# Patient Record
Sex: Male | Born: 1960 | Race: Black or African American | Hispanic: No | Marital: Single | Smoking: Current every day smoker
Health system: Southern US, Community
[De-identification: ages and names within clinical notes are randomized; demographics above are authoritative.]

## PROBLEM LIST (undated history)

## (undated) DIAGNOSIS — R319 Hematuria, unspecified: Principal | ICD-10-CM

## (undated) DIAGNOSIS — M1990 Unspecified osteoarthritis, unspecified site: Secondary | ICD-10-CM

## (undated) DIAGNOSIS — I10 Essential (primary) hypertension: Secondary | ICD-10-CM

## (undated) DIAGNOSIS — K219 Gastro-esophageal reflux disease without esophagitis: Secondary | ICD-10-CM

## (undated) HISTORY — PX: JOINT REPLACEMENT: SHX530

---

## 2014-06-27 ENCOUNTER — Encounter (HOSPITAL_COMMUNITY): Payer: Self-pay | Admitting: Emergency Medicine

## 2014-06-27 ENCOUNTER — Emergency Department (HOSPITAL_COMMUNITY)
Admission: EM | Admit: 2014-06-27 | Discharge: 2014-06-28 | Disposition: A | Discharge status: Home / Self Care | Payer: Medicare Other | Attending: Emergency Medicine | Admitting: Emergency Medicine

## 2014-06-27 DIAGNOSIS — Z720 Tobacco use: Secondary | ICD-10-CM | POA: Diagnosis not present

## 2014-06-27 DIAGNOSIS — Z96652 Presence of left artificial knee joint: Secondary | ICD-10-CM | POA: Insufficient documentation

## 2014-06-27 DIAGNOSIS — I10 Essential (primary) hypertension: Secondary | ICD-10-CM | POA: Insufficient documentation

## 2014-06-27 DIAGNOSIS — M25461 Effusion, right knee: Secondary | ICD-10-CM

## 2014-06-27 DIAGNOSIS — M25462 Effusion, left knee: Secondary | ICD-10-CM | POA: Insufficient documentation

## 2014-06-27 DIAGNOSIS — Z96651 Presence of right artificial knee joint: Secondary | ICD-10-CM | POA: Diagnosis not present

## 2014-06-27 DIAGNOSIS — Z8719 Personal history of other diseases of the digestive system: Secondary | ICD-10-CM | POA: Diagnosis not present

## 2014-06-27 HISTORY — DX: Essential (primary) hypertension: I10

## 2014-06-27 HISTORY — DX: Gastro-esophageal reflux disease without esophagitis: K21.9

## 2014-06-27 HISTORY — DX: Unspecified osteoarthritis, unspecified site: M19.90

## 2014-06-27 NOTE — ED Notes (Signed)
Per EMS, Pt had a knee replacement three years ago and over the past month his L knee and leg has been swelling off and on. PCP sts that it's fluid and that drawing it off would do no good because it would just come back. Pt ambulatory. A&Ox4.

## 2014-06-28 ENCOUNTER — Emergency Department (HOSPITAL_COMMUNITY): Payer: Medicare Other

## 2014-06-28 ENCOUNTER — Ambulatory Visit: Payer: Self-pay | Admitting: Internal Medicine

## 2014-06-28 DIAGNOSIS — M25462 Effusion, left knee: Secondary | ICD-10-CM | POA: Diagnosis not present

## 2014-06-28 MED ORDER — HYDROCODONE-ACETAMINOPHEN 5-325 MG PO TABS: 1.0000 | ORAL_TABLET | Freq: Two times a day (BID) | ORAL | Status: AC | PRN

## 2014-06-28 MED ORDER — HYDROCODONE-ACETAMINOPHEN 5-325 MG PO TABS
2.0000 | ORAL_TABLET | Freq: Once | ORAL | Status: AC
  Administered 2014-06-28: 2 via ORAL
  Filled 2014-06-28: qty 2

## 2014-06-28 NOTE — Discharge Instructions (Signed)
Knee Effusion Mr. Shane Glenn, your knee x-rays does not show any fracture. Follow-up with orthopedic surgery within 3 days for continued management. If symptoms worsen come back to the emergency department immediately. Thank you.  Knee effusion means you have fluid in your knee. The knee may be more difficult to bend and move. HOME CARE  Use crutches or a brace as told by your doctor.  Put ice on the injured area.  Put ice in a plastic bag.  Place a towel between your skin and the bag.  Leave the ice on for 15-20 minutes, 03-04 times a day.  Raise (elevate) your knee as much as possible.  Only take medicine as told by your doctor.  You may need to do strengthening exercises. Ask your doctor.  Continue with your normal diet and activities as told by your doctor. GET HELP RIGHT AWAY IF:  You have more puffiness (swelling) in your knee.  You see redness, puffiness, or have more pain in your knee.  You have a temperature by mouth above 102 F (38.9 C).  You get a rash.  You have trouble breathing.  You have a reaction to any medicine you are taking.  You have a lot of pain when you move your knee. MAKE SURE YOU:  Understand these instructions.  Will watch your condition.  Will get help right away if you are not doing well or get worse. Document Released: 05/18/2010 Document Revised: 07/08/2011 Document Reviewed: 05/18/2010 [facility] Patient Information 2015 [employer] [place]. This information is not intended to replace advice given to you by your health care provider. Make sure you discuss any questions you have with your health care provider.

## 2014-06-28 NOTE — ED Provider Notes (Signed)
CSN: [id number]     Arrival date & time 06/27/14  2147 History   First MD Initiated Contact with Patient 06/28/14 0012     Chief Complaint  Patient presents with  . Knee Pain  . Joint Swelling     (Consider location/radiation/quality/duration/timing/severity/associated sxs/prior Treatment) HPI   Shane Glenn is a 54 y.o. male with past medical history of multiple knee replacements presenting today with knee swelling. Patient states he had a right knee replacement thousand 10, followed by left knee replacement in 2011, 2012, and 2013. He has had on-and-off swelling since then for years. He denies any new trauma. His episode currently began at the beginning of February and he believes there is fluid in the joint. [facility] [place] [facility] and does not have a primary care physician or orthopedic surgeon. He states both knees are swollen but left is worse than right. He denies any fevers or recent infections. Patient has been taking Norco for pain relief. He has no further complaints.  10 Systems reviewed and are negative for acute change except as noted in the HPI.    Past Medical History  Diagnosis Date  . Hypertension   . Arthritis   . GERD (gastroesophageal reflux disease)    Past Surgical History  Procedure Laterality Date  . Joint replacement     No family history on file. History  Substance Use Topics  . Smoking status: Current Every Day Smoker -- 0.50 packs/day  . Smokeless tobacco: Not on file  . Alcohol Use: Yes    Review of Systems    Allergies  Celebrex and Ibuprofen  Home Medications   Prior to Admission medications   Not on File   BP 138/84 mmHg  Pulse 76  Temp(Src) 98.5 F (36.9 C) (Oral)  Resp 16  SpO2 98% Physical Exam  Constitutional: He is oriented to person, place, and time. Vital signs are normal. He appears well-developed and well-nourished.  Non-toxic appearance. He does not appear ill. No distress.  HENT:  Head: Normocephalic and  atraumatic.  Nose: Nose normal.  Mouth/Throat: Oropharynx is clear and moist. No oropharyngeal exudate.  Eyes: Conjunctivae and EOM are normal. Pupils are equal, round, and reactive to light. No scleral icterus.  Neck: Normal range of motion. Neck supple. No tracheal deviation, no edema, no erythema and normal range of motion present. No thyroid mass and no thyromegaly present.  Cardiovascular: Normal rate, regular rhythm, S1 normal, S2 normal, normal heart sounds, intact distal pulses and normal pulses.  Exam reveals no gallop and no friction rub.   No murmur heard. Pulses:      Radial pulses are 2+ on the right side, and 2+ on the left side.       Dorsalis pedis pulses are 2+ on the right side, and 2+ on the left side.  Pulmonary/Chest: Effort normal and breath sounds normal. No respiratory distress. He has no wheezes. He has no rhonchi. He has no rales.  Abdominal: Soft. Normal appearance and bowel sounds are normal. He exhibits no distension, no ascites and no mass. There is no hepatosplenomegaly. There is no tenderness. There is no rebound, no guarding and no CVA tenderness.  Musculoskeletal: Normal range of motion. He exhibits no edema or tenderness.  Bilateral well-healed scars of bilateral knees. There is no warmth, erythema, or tenderness to palpation. Knee is obviously swollen.  Lymphadenopathy:    He has no cervical adenopathy.  Neurological: He is alert and oriented to person, place, and time.  He has normal strength. No cranial nerve deficit or sensory deficit.  Skin: Skin is warm, dry and intact. No petechiae and no rash noted. He is not diaphoretic. No erythema. No pallor.  Nursing note and vitals reviewed.   ED Course  Procedures (including critical care time) Labs Review Labs Reviewed - No data to display  Imaging Review Dg Knee Complete 4 Views Left  06/28/2014   CLINICAL DATA:  Chronic intermittent left knee swelling for 1 month, now worsening and spreading to the left  ankle. Initial encounter.  EXAM: LEFT KNEE - COMPLETE 4+ VIEW  COMPARISON:  None.  FINDINGS: There is no evidence of fracture or dislocation. There is loosening of the femoral component of the patient's prosthesis, with lucency measuring up to 7 mm surrounding the femoral stem. The tibial component is grossly unremarkable. Degenerative change is noted with regard to the patella. Scattered postoperative fragments are seen about the knee joint.  A moderate knee joint effusion is suspected.  IMPRESSION: 1. No evidence of fracture or dislocation. 2. Loosening of the femoral component of the patient's prosthesis, with lucency measuring up to 7 mm surrounding the femoral stem. 3. Suspect moderate knee joint effusion.   Electronically Signed   By: Roanna Raider M.D.   On: 06/28/2014 01:00     EKG Interpretation None      MDM   Final diagnoses:  None    Patient since emergency department for worsening bilateral knee swelling. This and the setting of having multiple knee replacement surgeries. I have no concern for septic arthritis as there is no warmth, tenderness, or erythema around the knee. X-rays were obtained by triage notes do not show any acute abnormalities other than loosening of femoral component, this is likely chronic given his surgical history. I discussed the case with Dr. Eulah Pont who agrees with plan and close ortho follow up.  Patient will be given a short course of Norco for pain relief and orthopedic surgery follow-up. His vital signs remain within his normal limits is safe for discharge.    Tomasita Crumble, MD 06/28/14 [phone]

## 2014-09-01 ENCOUNTER — Emergency Department (HOSPITAL_COMMUNITY): Payer: Medicare Other

## 2014-09-01 ENCOUNTER — Encounter (HOSPITAL_COMMUNITY): Payer: Self-pay

## 2014-09-01 ENCOUNTER — Emergency Department (HOSPITAL_COMMUNITY)
Admission: EM | Admit: 2014-09-01 | Discharge: 2014-09-01 | Disposition: A | Discharge status: Home / Self Care | Payer: Medicare Other | Attending: Emergency Medicine | Admitting: Emergency Medicine

## 2014-09-01 DIAGNOSIS — Z79899 Other long term (current) drug therapy: Secondary | ICD-10-CM | POA: Insufficient documentation

## 2014-09-01 DIAGNOSIS — I10 Essential (primary) hypertension: Secondary | ICD-10-CM | POA: Diagnosis not present

## 2014-09-01 DIAGNOSIS — R109 Unspecified abdominal pain: Secondary | ICD-10-CM | POA: Diagnosis present

## 2014-09-01 DIAGNOSIS — Z791 Long term (current) use of non-steroidal anti-inflammatories (NSAID): Secondary | ICD-10-CM | POA: Diagnosis not present

## 2014-09-01 DIAGNOSIS — Z7952 Long term (current) use of systemic steroids: Secondary | ICD-10-CM | POA: Diagnosis not present

## 2014-09-01 DIAGNOSIS — N401 Enlarged prostate with lower urinary tract symptoms: Secondary | ICD-10-CM | POA: Insufficient documentation

## 2014-09-01 DIAGNOSIS — K219 Gastro-esophageal reflux disease without esophagitis: Secondary | ICD-10-CM | POA: Insufficient documentation

## 2014-09-01 DIAGNOSIS — M1990 Unspecified osteoarthritis, unspecified site: Secondary | ICD-10-CM | POA: Diagnosis not present

## 2014-09-01 DIAGNOSIS — Z792 Long term (current) use of antibiotics: Secondary | ICD-10-CM | POA: Insufficient documentation

## 2014-09-01 DIAGNOSIS — N400 Enlarged prostate without lower urinary tract symptoms: Secondary | ICD-10-CM

## 2014-09-01 DIAGNOSIS — R339 Retention of urine, unspecified: Secondary | ICD-10-CM | POA: Insufficient documentation

## 2014-09-01 LAB — URINALYSIS, ROUTINE W REFLEX MICROSCOPIC
Bilirubin Urine: NEGATIVE
Bilirubin Urine: NEGATIVE
GLUCOSE, UA: NEGATIVE mg/dL
Glucose, UA: NEGATIVE mg/dL
Ketones, ur: 15 mg/dL — AB
Ketones, ur: NEGATIVE mg/dL
LEUKOCYTES UA: NEGATIVE
Nitrite: NEGATIVE
Nitrite: NEGATIVE
PH: 6.5 (ref 5.0–8.0)
Protein, ur: NEGATIVE mg/dL
Protein, ur: NEGATIVE mg/dL
Specific Gravity, Urine: 1.018 (ref 1.005–1.030)
Specific Gravity, Urine: 1.026 (ref 1.005–1.030)
Urobilinogen, UA: 0.2 mg/dL (ref 0.0–1.0)
Urobilinogen, UA: 0.2 mg/dL (ref 0.0–1.0)
pH: 5.5 (ref 5.0–8.0)

## 2014-09-01 LAB — COMPREHENSIVE METABOLIC PANEL
ALBUMIN: 3.7 g/dL (ref 3.5–5.0)
ALT: 35 U/L (ref 17–63)
AST: 26 U/L (ref 15–41)
Alkaline Phosphatase: 93 U/L (ref 38–126)
Anion gap: 10 (ref 5–15)
BUN: 10 mg/dL (ref 6–20)
CO2: 22 mmol/L (ref 22–32)
CREATININE: 0.91 mg/dL (ref 0.61–1.24)
Calcium: 9 mg/dL (ref 8.9–10.3)
Chloride: 104 mmol/L (ref 101–111)
Glucose, Bld: 94 mg/dL (ref 70–99)
Potassium: 3.5 mmol/L (ref 3.5–5.1)
Sodium: 136 mmol/L (ref 135–145)
Total Bilirubin: 0.9 mg/dL (ref 0.3–1.2)
Total Protein: 6.9 g/dL (ref 6.5–8.1)

## 2014-09-01 LAB — CBC WITH DIFFERENTIAL/PLATELET
Basophils Absolute: 0 10*3/uL (ref 0.0–0.1)
Basophils Relative: 0 % (ref 0–1)
Eosinophils Absolute: 0.1 10*3/uL (ref 0.0–0.7)
Eosinophils Relative: 1 % (ref 0–5)
HCT: 37.7 % — ABNORMAL LOW (ref 39.0–52.0)
Hemoglobin: 13.4 g/dL (ref 13.0–17.0)
Lymphocytes Relative: 21 % (ref 12–46)
Lymphs Abs: 1.2 10*3/uL (ref 0.7–4.0)
MCH: 29.1 pg (ref 26.0–34.0)
MCHC: 35.5 g/dL (ref 30.0–36.0)
MCV: 81.8 fL (ref 78.0–100.0)
Monocytes Absolute: 0.6 10*3/uL (ref 0.1–1.0)
Monocytes Relative: 10 % (ref 3–12)
Neutro Abs: 3.9 10*3/uL (ref 1.7–7.7)
Neutrophils Relative %: 68 % (ref 43–77)
Platelets: 185 10*3/uL (ref 150–400)
RBC: 4.61 MIL/uL (ref 4.22–5.81)
RDW: 13.1 % (ref 11.5–15.5)
WBC: 5.8 10*3/uL (ref 4.0–10.5)

## 2014-09-01 LAB — TROPONIN I: Troponin I: <0.03 ng/mL (ref <0.031)

## 2014-09-01 LAB — URINE MICROSCOPIC-ADD ON

## 2014-09-01 LAB — RAPID HIV SCREEN (HIV 1/2 AB+AG)
HIV 1/2 Antibodies: NONREACTIVE
HIV-1 P24 Antigen - HIV24: NONREACTIVE

## 2014-09-01 LAB — LIPASE, BLOOD: Lipase: 22 U/L (ref 22–51)

## 2014-09-01 LAB — PROTIME-INR
INR: 1.1 (ref 0.00–1.49)
Prothrombin Time: 14.3 seconds (ref 11.6–15.2)

## 2014-09-01 MED ORDER — HYDROMORPHONE HCL 1 MG/ML IJ SOLN
1.0000 mg | Freq: Once | INTRAMUSCULAR | Status: AC
  Administered 2014-09-01: 1 mg via INTRAVENOUS
  Filled 2014-09-01: qty 1

## 2014-09-01 MED ORDER — CIPROFLOXACIN HCL 500 MG PO TABS: 500.0000 mg | ORAL_TABLET | Freq: Two times a day (BID) | ORAL | Status: AC

## 2014-09-01 MED ORDER — IOHEXOL 300 MG/ML  SOLN
100.0000 mL | Freq: Once | INTRAMUSCULAR | Status: AC | PRN
  Administered 2014-09-01: 100 mL via INTRAVENOUS

## 2014-09-01 MED ORDER — TAMSULOSIN HCL 0.4 MG PO CAPS: 0.4000 mg | ORAL_CAPSULE | Freq: Every day | ORAL | Status: AC

## 2014-09-01 NOTE — Consult Note (Signed)
Urology Consult   Physician requesting consult: Dr. Arby Barrette  Reason for consult: Urinary retention  History of Present Illness: Shane Glenn is a 54 y.o. male with past medical history of multiple knee replacements presenting today with inability to void.  He reports that his urine stream was normal until a few days ago when he began dribbling urine.  He has no dysuria.  He did have one episode of gross hematuria a few weeks ago.  This morning he was not able to urinate at all.    In the ER he was found to be hemodynamically and unable to void.  Following two failed attempts at placement of catheter, we were consulted to assist.  When we saw him, he had bloody output from the foley and it was not irrigating or draining.  Recent CT scan in the ER today showed a remarkably enlarged prostate with large volume of fluid in the bladder and misplaced foley catheter with balloon inflated in the urethra.   Past Medical History  Diagnosis Date  . Hypertension   . Arthritis   . GERD (gastroesophageal reflux disease)     Past Surgical History  Procedure Laterality Date  . Joint replacement      Medications:  Home meds:    Medication List    ASK your doctor about these medications        fluticasone 50 MCG/ACT nasal spray  Commonly known as:  FLONASE  Place into both nostrils daily.     HYDROcodone-acetaminophen 5-325 MG per tablet  Commonly known as:  NORCO/VICODIN  Take 1 tablet by mouth 2 (two) times daily as needed for severe pain.     lisinopril 10 MG tablet  Commonly known as:  PRINIVIL,ZESTRIL  Take 10 mg by mouth daily.     meloxicam 15 MG tablet  Commonly known as:  MOBIC  Take 15 mg by mouth daily.     omeprazole 40 MG capsule  Commonly known as:  PRILOSEC  Take 40 mg by mouth daily.       Allergies:  Allergies  Allergen Reactions  . Celebrex [Celecoxib] Itching  . Ibuprofen Other (See Comments)    Upset stomach   History reviewed. No pertinent family  history.  Social History:  reports that he has been smoking.  He does not have any smokeless tobacco history on file. He reports that he drinks alcohol. His drug history is not on file.  ROS: A complete review of systems was performed.  All systems are negative except for pertinent findings as noted.  Physical Exam:  Vital signs in last 24 hours: Temp:  [98 F (36.7 C)] 98 F (36.7 C) (05/05 0813) Pulse Rate:  [63-89] 71 (05/05 1230) Resp:  [17-42] 17 (05/05 1230) BP: (138-161)/(85-97) 144/87 mmHg (05/05 1230) SpO2:  [98 %-100 %] 98 % (05/05 1230) Weight:  [126.1 kg (278 lb)] 126.1 kg (278 lb) (05/05 0813)  Constitutional:  Alert and oriented, No acute distress Cardiovascular: Regular rate and rhythm, No JVD Respiratory: Normal respiratory effort, Lungs clear bilaterally GI: Abdomen is soft, nontender, nondistended, no abdominal masses Genitourinary: No CVAT. Normal male phallus, testes are descended bilaterally and non-tender and without masses, scrotum is normal in appearance without lesions or masses, perineum is normal on inspection. Lymphatic: No lymphadenopathy Neurologic: Grossly intact, no focal deficits Psychiatric: Normal mood and affect  Laboratory Data:   Recent Labs  09/01/14 0858  WBC 5.8  HGB 13.4  HCT 37.7*  PLT 185  Recent Labs  09/01/14 0858  NA 136  K 3.5  CL 104  GLUCOSE 94  BUN 10  CALCIUM 9.0  CREATININE 0.91     Results for orders placed or performed during the hospital encounter of 09/01/14 (from the past 24 hour(s))  Urinalysis with microscopic     Status: Abnormal   Collection Time: 09/01/14  8:16 AM  Result Value Ref Range   Color, Urine YELLOW YELLOW   APPearance CLOUDY (A) CLEAR   Specific Gravity, Urine 1.018 1.005 - 1.030   pH 5.5 5.0 - 8.0   Glucose, UA NEGATIVE NEGATIVE mg/dL   Hgb urine dipstick TRACE (A) NEGATIVE   Bilirubin Urine NEGATIVE NEGATIVE   Ketones, ur 15 (A) NEGATIVE mg/dL   Protein, ur NEGATIVE NEGATIVE  mg/dL   Urobilinogen, UA 0.2 0.0 - 1.0 mg/dL   Nitrite NEGATIVE NEGATIVE   Leukocytes, UA TRACE (A) NEGATIVE  Urine microscopic-add on     Status: None   Collection Time: 09/01/14  8:16 AM  Result Value Ref Range   Squamous Epithelial / LPF RARE RARE   WBC, UA 0-2 <3 WBC/hpf   RBC / HPF 0-2 <3 RBC/hpf   Bacteria, UA RARE RARE   Urine-Other MUCOUS PRESENT   Comprehensive metabolic panel     Status: None   Collection Time: 09/01/14  8:58 AM  Result Value Ref Range   Sodium 136 135 - 145 mmol/L   Potassium 3.5 3.5 - 5.1 mmol/L   Chloride 104 101 - 111 mmol/L   CO2 22 22 - 32 mmol/L   Glucose, Bld 94 70 - 99 mg/dL   BUN 10 6 - 20 mg/dL   Creatinine, Ser 1.610.91 0.61 - 1.24 mg/dL   Calcium 9.0 8.9 - 09.610.3 mg/dL   Total Protein 6.9 6.5 - 8.1 g/dL   Albumin 3.7 3.5 - 5.0 g/dL   AST 26 15 - 41 U/L   ALT 35 17 - 63 U/L   Alkaline Phosphatase 93 38 - 126 U/L   Total Bilirubin 0.9 0.3 - 1.2 mg/dL   GFR calc non Af Amer >60 >60 mL/min   GFR calc Af Amer >60 >60 mL/min   Anion gap 10 5 - 15  Lipase, blood     Status: None   Collection Time: 09/01/14  8:58 AM  Result Value Ref Range   Lipase 22 22 - 51 U/L  Troponin I     Status: None   Collection Time: 09/01/14  8:58 AM  Result Value Ref Range   Troponin I <0.03 <0.031 ng/mL  CBC with Differential     Status: Abnormal   Collection Time: 09/01/14  8:58 AM  Result Value Ref Range   WBC 5.8 4.0 - 10.5 K/uL   RBC 4.61 4.22 - 5.81 MIL/uL   Hemoglobin 13.4 13.0 - 17.0 g/dL   HCT 04.537.7 (L) 40.939.0 - 81.152.0 %   MCV 81.8 78.0 - 100.0 fL   MCH 29.1 26.0 - 34.0 pg   MCHC 35.5 30.0 - 36.0 g/dL   RDW 91.413.1 78.211.5 - 95.615.5 %   Platelets 185 150 - 400 K/uL   Neutrophils Relative % 68 43 - 77 %   Neutro Abs 3.9 1.7 - 7.7 K/uL   Lymphocytes Relative 21 12 - 46 %   Lymphs Abs 1.2 0.7 - 4.0 K/uL   Monocytes Relative 10 3 - 12 %   Monocytes Absolute 0.6 0.1 - 1.0 K/uL   Eosinophils Relative 1 0 - 5 %  Eosinophils Absolute 0.1 0.0 - 0.7 K/uL   Basophils  Relative 0 0 - 1 %   Basophils Absolute 0.0 0.0 - 0.1 K/uL  Protime-INR     Status: None   Collection Time: 09/01/14  8:58 AM  Result Value Ref Range   Prothrombin Time 14.3 11.6 - 15.2 seconds   INR 1.10 0.00 - 1.49  Rapid HIV screen (HIV 1/2 Ab+Ag)     Status: None   Collection Time: 09/01/14  9:30 AM  Result Value Ref Range   HIV-1 P24 Antigen - HIV24 NON REACTIVE NON REACTIVE   HIV 1/2 Antibodies NON REACTIVE NON REACTIVE   Interpretation (HIV Ag Ab)      A non reactive test result means that HIV 1 or HIV 2 antibodies and HIV 1 p24 antigen were not detected in the specimen.   No results found for this or any previous visit (from the past 240 hour(s)).  Renal Function:  Recent Labs  09/01/14 0858  CREATININE 0.91   Estimated Creatinine Clearance: 136.8 mL/min (by C-G formula based on Cr of 0.91).  Radiologic Imaging: Ct Abdomen Pelvis W Contrast  09/01/2014   CLINICAL DATA:  One day history of periumbilical pain radiating into the inguinal regions. Urinary frequency and dysuria. Hematuria.  EXAM: CT ABDOMEN AND PELVIS WITH CONTRAST  TECHNIQUE: Multidetector CT imaging of the abdomen and pelvis was performed using the standard protocol following bolus administration of intravenous contrast.  CONTRAST:  OMNIPAQUE IOHEXOL 300 MG/ML  SOLN  COMPARISON:  None.  FINDINGS: There is no edema or consolidation in the lung bases. On axial slice 6 series 3, there is a 5 mm nodular opacity abutting the pleura in the superior segment of the left lower lobe.  No focal liver lesions are identified. Gallbladder is borderline distended without wall thickening. There is no appreciable biliary duct dilatation.  Spleen, pancreas, and adrenals appear normal.  There is mild fullness of both renal collecting systems. There is no renal mass on either side. There is no renal or ureteral calculus on either side. Both ureters are mildly prominent. There is a left retro aortic renal vein, an anatomic variant.   In the pelvis, there is a Foley catheter which is not seen beyond the lower prostatic/membranous urethra. Urinary bladder is moderately distended. Air in the urinary bladder is most likely due to this recent instrumentation. The prostate is enlarged in a generalized manner and impresses upon the inferior aspect of the bladder. There is loss of a fat plane between the inferior bladder and the superior prostate. There is soft tissue irregularity in this area.  There is no pelvic mass or pelvic fluid collection beyond questionable prostatic lesion.  There is no bowel obstruction. No free air or portal venous air. There is no periappendiceal region inflammation.  There is no ascites, adenopathy, or abscess in the abdomen or pelvis. There is atherosclerotic change in the aorta but no aneurysm. There are no blastic or lytic bone lesions. There is degenerative change throughout the lumbar spine.  IMPRESSION: The prostate is diffusely enlarged. There is irregularity along the superior aspect of the prostate at the level of the inferior bladder. There is impression on the urinary bladder from this enlarged prostate. The possibility of prostate carcinoma must be of concern given this appearance. Appropriate clinical and laboratory evaluation advised.  There is a Foley catheter with the balloon distended and region of the lower prostatic/membranous urethra.  Air in the urinary bladder is probably due to  recent instrumentation. Fullness of both renal collecting systems is probably due to chronic urinary bladder distention. No renal or ureteral calculus seen.  No bowel obstruction. No abscess. Gallbladder borderline distended without wall thickening or pericholecystic fluid.  5 mm nodular opacity left lower lobe. This finding warrants follow-up evaluation based on Fleischner Society guidelines. If the patient is at high risk for bronchogenic carcinoma, follow-up chest CT at 6-12 months is recommended. If the patient is at low  risk for bronchogenic carcinoma, follow-up chest CT at 12 months is recommended. This recommendation follows the consensus statement: Guidelines for Management of Small Pulmonary Nodules Detected on CT Scans: A Statement from the Fleischner Society as published in Radiology 2005;237:395-400.  Critical Value/emergent results were called by telephone at the time of interpretation on 09/01/2014 at 11:29 am to Dr. Arby BarretteMARCY PFEIFFER , who verbally acknowledged these results.   Electronically Signed   By: Bretta BangWilliam  Woodruff III M.D.   On: 09/01/2014 11:30    I independently reviewed the above imaging studies.  Procedure: After patient had been prepped and draped in the usual sterile fashion, a 20 french coude tip catheter was placed per urethra.  Due to large prostate and long urethra, catheter was nearly inserted to its maximum length to reach the bladder.  20cc of water inflated into the balloon.  >500cc of rusty brown urine drained following placement.  I then irrigated a small amount of clot and irrigation quickly cleared to non-bloody.   Impression/Recommendation  54yo with new urinary retention and new gross hematuria in the context of traumatic foley catheterization.  Urinary retention:  Patient with remarkably enlarged prostate and likely bladder outlet obstruction as etiology of retention.  Continue foley catheter and patient will follow up in one week for trial of void.  Discharge on flomax.  Gross hematuria.  Patient reports one episode of gross hematuria a few weeks ago.  Given age and gross hematuria previously, recommend full hematuria wrokup with local cystoscopy, CT urogram and urine cytology - will coordinate this as outpatient.

## 2014-09-01 NOTE — ED Notes (Signed)
Urology at bedside with pt

## 2014-09-01 NOTE — ED Notes (Signed)
679cc in bladder

## 2014-09-01 NOTE — ED Notes (Signed)
Urology at bedside.

## 2014-09-01 NOTE — ED Notes (Signed)
Pt reports periumbilical pain that radiates down into groin that began yesterday.  Pt reports urinary frequency and dysuria as well.  Pt denies any fever, chills or n/v/d.

## 2014-09-01 NOTE — ED Provider Notes (Signed)
CSN: [id number]     Arrival date & time 09/01/14  0809 History   First MD Initiated Contact with Patient 09/01/14 0815     Chief Complaint  Patient presents with  . Abdominal Pain     (Consider location/radiation/quality/duration/timing/severity/associated sxs/prior Treatment) HPI The patient states for 2 days now he has felt urgency to try to urinate but been unable to go any more than a few drops at a time. He reports he is getting spasm pains almost every 5 minutes but can't seem to relieve it by trying to go the bathroom. He denies he's having pain into his back. He denies having a similar problem in the past. He reports he had some blood work done for his prostate at the beginning of the year and there wasn't any problem with it. He has no known history of urinary retention per his report. He denies any new medications or any recent over-the-counter medications that he has taken. Past Medical History  Diagnosis Date  . Hypertension   . Arthritis   . GERD (gastroesophageal reflux disease)    Past Surgical History  Procedure Laterality Date  . Joint replacement     History reviewed. No pertinent family history. History  Substance Use Topics  . Smoking status: Current Every Day Smoker -- 0.50 packs/day  . Smokeless tobacco: Not on file  . Alcohol Use: Yes    Review of Systems  10 Systems reviewed and are negative for acute change except as noted in the HPI.   Allergies  Celebrex and Ibuprofen  Home Medications   Prior to Admission medications   Medication Sig Start Date End Date Taking? Authorizing Provider  fluticasone (FLONASE) 50 MCG/ACT nasal spray Place into both nostrils daily.   Yes Historical Provider, MD  HYDROcodone-acetaminophen (NORCO/VICODIN) 5-325 MG per tablet Take 1 tablet by mouth 2 (two) times daily as needed for severe pain. 06/28/14  Yes Tomasita CrumbleAdeleke Oni, MD  lisinopril (PRINIVIL,ZESTRIL) 10 MG tablet Take 10 mg by mouth daily.   Yes Historical Provider, MD   meloxicam (MOBIC) 15 MG tablet Take 15 mg by mouth daily.   Yes Historical Provider, MD  omeprazole (PRILOSEC) 40 MG capsule Take 40 mg by mouth daily.   Yes Historical Provider, MD  ciprofloxacin (CIPRO) 500 MG tablet Take 1 tablet (500 mg total) by mouth 2 (two) times daily. 09/01/14   Arby BarretteMarcy Rieley Khalsa, MD  tamsulosin (FLOMAX) 0.4 MG CAPS capsule Take 1 capsule (0.4 mg total) by mouth daily. 09/01/14   Arby BarretteMarcy Lakeasha Petion, MD   BP 145/79 mmHg  Pulse 79  Temp(Src) 98 F (36.7 C) (Oral)  Resp 19  Ht 6\' 7"  (2.007 m)  Wt 278 lb (126.1 kg)  BMI 31.31 kg/m2  SpO2 98% Physical Exam  Constitutional: He is oriented to person, place, and time.  The patient appears to be in moderate to severe pain. He is alert and nontoxic. He has no respiratory distress.  HENT:  Head: Normocephalic and atraumatic.  Eyes: EOM are normal.  Cardiovascular: Normal rate, regular rhythm, normal heart sounds and intact distal pulses.   Pulmonary/Chest: Effort normal and breath sounds normal. No respiratory distress.  Abdominal: He exhibits distension. There is tenderness.  The patient's lower abdomen is distended and firm. He has tenderness over the suprapubic region and infraumbilical. The upper abdomen is soft.  Musculoskeletal: Normal range of motion.  Neurological: He is alert and oriented to person, place, and time. Coordination normal.  Skin: Skin is warm and dry.  Psychiatric: He  has a normal mood and affect.    ED Course  Procedures (including critical care time) Labs Review Labs Reviewed  URINALYSIS, ROUTINE W REFLEX MICROSCOPIC - Abnormal; Notable for the following:    APPearance CLOUDY (*)    Hgb urine dipstick TRACE (*)    Ketones, ur 15 (*)    Leukocytes, UA TRACE (*)    All other components within normal limits  CBC WITH DIFFERENTIAL/PLATELET - Abnormal; Notable for the following:    HCT 37.7 (*)    All other components within normal limits  URINE CULTURE  COMPREHENSIVE METABOLIC PANEL  LIPASE,  BLOOD  TROPONIN I  PROTIME-INR  RAPID HIV SCREEN (HIV 1/2 AB+AG)  URINE MICROSCOPIC-ADD ON  URINALYSIS, ROUTINE W REFLEX MICROSCOPIC    Imaging Review Ct Abdomen Pelvis W Contrast  09/01/2014   CLINICAL DATA:  One day history of periumbilical pain radiating into the inguinal regions. Urinary frequency and dysuria. Hematuria.  EXAM: CT ABDOMEN AND PELVIS WITH CONTRAST  TECHNIQUE: Multidetector CT imaging of the abdomen and pelvis was performed using the standard protocol following bolus administration of intravenous contrast.  CONTRAST:  100mL OMNIPAQUE IOHEXOL 300 MG/ML  SOLN  COMPARISON:  None.  FINDINGS: There is no edema or consolidation in the lung bases. On axial slice 6 series 3, there is a 5 mm nodular opacity abutting the pleura in the superior segment of the left lower lobe.  No focal liver lesions are identified. Gallbladder is borderline distended without wall thickening. There is no appreciable biliary duct dilatation.  Spleen, pancreas, and adrenals appear normal.  There is mild fullness of both renal collecting systems. There is no renal mass on either side. There is no renal or ureteral calculus on either side. Both ureters are mildly prominent. There is a left retro aortic renal vein, an anatomic variant.  In the pelvis, there is a Foley catheter which is not seen beyond the lower prostatic/membranous urethra. Urinary bladder is moderately distended. Air in the urinary bladder is most likely due to this recent instrumentation. The prostate is enlarged in a generalized manner and impresses upon the inferior aspect of the bladder. There is loss of a fat plane between the inferior bladder and the superior prostate. There is soft tissue irregularity in this area.  There is no pelvic mass or pelvic fluid collection beyond questionable prostatic lesion.  There is no bowel obstruction. No free air or portal venous air. There is no periappendiceal region inflammation.  There is no ascites,  adenopathy, or abscess in the abdomen or pelvis. There is atherosclerotic change in the aorta but no aneurysm. There are no blastic or lytic bone lesions. There is degenerative change throughout the lumbar spine.  IMPRESSION: The prostate is diffusely enlarged. There is irregularity along the superior aspect of the prostate at the level of the inferior bladder. There is impression on the urinary bladder from this enlarged prostate. The possibility of prostate carcinoma must be of concern given this appearance. Appropriate clinical and laboratory evaluation advised.  There is a Foley catheter with the balloon distended and region of the lower prostatic/membranous urethra.  Air in the urinary bladder is probably due to recent instrumentation. Fullness of both renal collecting systems is probably due to chronic urinary bladder distention. No renal or ureteral calculus seen.  No bowel obstruction. No abscess. Gallbladder borderline distended without wall thickening or pericholecystic fluid.  5 mm nodular opacity left lower lobe. This finding warrants follow-up evaluation based on Fleischner Society guidelines. If the  patient is at high risk for bronchogenic carcinoma, follow-up chest CT at 6-12 months is recommended. If the patient is at low risk for bronchogenic carcinoma, follow-up chest CT at 12 months is recommended. This recommendation follows the consensus statement: Guidelines for Management of Small Pulmonary Nodules Detected on CT Scans: A Statement from the Fleischner Society as published in Radiology 2005;237:395-400.  Critical Value/emergent results were called by telephone at the time of interpretation on 09/01/2014 at 11:29 am to Dr. Arby Barrette , who verbally acknowledged these results.   Electronically Signed   By: Bretta Bang III M.D.   On: 09/01/2014 11:30     EKG Interpretation None       Nursing had placed first Foley catheter for urinary retention with return of less than 10 mils of  pink tinged urine. I used the transabdominal ultrasound to examine the bladder after the Foley placement and identified a large amount of urine with bladder distention. By ultrasound there appeared to be a mobile hyperechoic round structure at that I felt was the Foley balloon. Due to the small amount of urine returned being blood-tinged I suspected obstruction due to clot. At that time I had the nurse place a [address] [place] 3-way catheter for potential irrigation. By visual inspection the catheter was in the meatus with less than 2 inches of catheter remaining. There was no blood around the meatus and no firmness at the base of the penis or the scrotum to suggest a catheter was coiled. Due to this finding is felt the catheter was likely in the bladder and withdrew and irrigated to try to remove clot. Soft red clot was removed however the catheter still failed to drain. At that point time Dr. Isabel Caprice was consult it and CT scan obtained. Once CT identified Foley balloon within the prostatic urethra, I deflated the balloon and the catheter move freely within the urethral canal however with insertion to its full length it still failed to return any urine. CONSULT 10:57: Discussed with Dr. Isabel Caprice. Discussed management with first Foley catheter and then three-way catheter with irrigation. At this point Dr. Isabel Caprice will review the CT findings and subsequent management based on results. MDM   Final diagnoses:  Urinary retention  Prostatic hypertrophy   Patient was found to have large prostatic hypertrophy. Urology fellow placed a catheter with coud noting that due to the length of the patient's urethra and prostate, the catheter required a very deep insertion to reach the bladder. At this point per consultation, the plan will be for the patient to be started on Flomax and follow up with urology as an outpatient for further diagnostic workup for prostatic hypertrophy.    Arby Barrette, MD 09/01/14 1346

## 2014-09-01 NOTE — ED Notes (Signed)
Pfeiffer MD made aware of catheter placement, bladder scan results and the 0cc output from catheter.  Further orders received.

## 2014-09-01 NOTE — Discharge Instructions (Signed)
Acute Urinary Retention °Acute urinary retention is the temporary inability to urinate. °This is a common problem in older men. As men age their prostates become larger and block the flow of urine from the bladder. This is usually a problem that has come on gradually.  °HOME CARE INSTRUCTIONS °If you are sent home with a Foley catheter and a drainage system, you will need to discuss the best course of action with your health care provider. While the catheter is in, maintain a good intake of fluids. Keep the drainage bag emptied and lower than your catheter. This is so that contaminated urine will not flow back into your bladder, which could lead to a urinary tract infection. °There are two main types of drainage bags. One is a large bag that usually is used at night. It has a good capacity that will allow you to sleep through the night without having to empty it. The second type is called a leg bag. It has a smaller capacity, so it needs to be emptied more frequently. However, the main advantage is that it can be attached by a leg strap and can go underneath your clothing, allowing you the freedom to move about or leave your home. °Only take over-the-counter or prescription medicines for pain, discomfort, or fever as directed by your health care provider.  °SEEK MEDICAL CARE IF: °· You develop a low-grade fever. °· You experience spasms or leakage of urine with the spasms. °SEEK IMMEDIATE MEDICAL CARE IF:  °· You develop chills or fever. °· Your catheter stops draining urine. °· Your catheter falls out. °· You start to develop increased bleeding that does not respond to rest and increased fluid intake. °MAKE SURE YOU: °· Understand these instructions. °· Will watch your condition. °· Will get help right away if you are not doing well or get worse. °Document Released: 07/22/2000 Document Revised: 04/20/2013 Document Reviewed: 09/24/2012 °[employer]® Patient Information ©2015 [employer]. This information is not  intended to replace advice given to you by your health care provider. Make sure you discuss any questions you have with your health care provider. ° °

## 2014-09-01 NOTE — ED Notes (Signed)
Pfeiffer MD at bedside attempting to irrigate foley.  Several blood clots were irrigated but there is still minimal outcome from urinary catheter.

## 2014-09-01 NOTE — ED Notes (Signed)
Leg bag placed on pt.  Pt instructed on how to care for catheter.  Pt verbalized understanding.

## 2014-09-02 LAB — URINE CULTURE
COLONY COUNT: NO GROWTH
Culture: NO GROWTH

## 2014-09-06 ENCOUNTER — Emergency Department (HOSPITAL_COMMUNITY)
Admission: EM | Admit: 2014-09-06 | Discharge: 2014-09-06 | Disposition: A | Discharge status: Home / Self Care | Payer: Medicare Other | Attending: Emergency Medicine | Admitting: Emergency Medicine

## 2014-09-06 ENCOUNTER — Emergency Department (HOSPITAL_COMMUNITY): Payer: Medicare Other

## 2014-09-06 ENCOUNTER — Encounter (HOSPITAL_COMMUNITY): Payer: Self-pay | Admitting: *Deleted

## 2014-09-06 DIAGNOSIS — Z7951 Long term (current) use of inhaled steroids: Secondary | ICD-10-CM | POA: Insufficient documentation

## 2014-09-06 DIAGNOSIS — Z720 Tobacco use: Secondary | ICD-10-CM | POA: Diagnosis not present

## 2014-09-06 DIAGNOSIS — I10 Essential (primary) hypertension: Secondary | ICD-10-CM | POA: Insufficient documentation

## 2014-09-06 DIAGNOSIS — R319 Hematuria, unspecified: Secondary | ICD-10-CM | POA: Diagnosis present

## 2014-09-06 DIAGNOSIS — K219 Gastro-esophageal reflux disease without esophagitis: Secondary | ICD-10-CM | POA: Insufficient documentation

## 2014-09-06 DIAGNOSIS — N401 Enlarged prostate with lower urinary tract symptoms: Secondary | ICD-10-CM | POA: Insufficient documentation

## 2014-09-06 DIAGNOSIS — Z79899 Other long term (current) drug therapy: Secondary | ICD-10-CM | POA: Diagnosis not present

## 2014-09-06 DIAGNOSIS — R339 Retention of urine, unspecified: Secondary | ICD-10-CM

## 2014-09-06 DIAGNOSIS — Z792 Long term (current) use of antibiotics: Secondary | ICD-10-CM | POA: Diagnosis not present

## 2014-09-06 DIAGNOSIS — M25522 Pain in left elbow: Secondary | ICD-10-CM | POA: Diagnosis not present

## 2014-09-06 DIAGNOSIS — N400 Enlarged prostate without lower urinary tract symptoms: Secondary | ICD-10-CM

## 2014-09-06 LAB — URINALYSIS, ROUTINE W REFLEX MICROSCOPIC
Bilirubin Urine: NEGATIVE
Glucose, UA: NEGATIVE mg/dL
Ketones, ur: NEGATIVE mg/dL
Nitrite: NEGATIVE
Protein, ur: 30 mg/dL — AB
SPECIFIC GRAVITY, URINE: 1.013 (ref 1.005–1.030)
UROBILINOGEN UA: 1 mg/dL (ref 0.0–1.0)
pH: 5.5 (ref 5.0–8.0)

## 2014-09-06 LAB — URINE MICROSCOPIC-ADD ON

## 2014-09-06 MED ORDER — SODIUM CHLORIDE 0.9 % IV BOLUS (SEPSIS)
1000.0000 mL | Freq: Once | INTRAVENOUS | Status: AC
  Administered 2014-09-06: 1000 mL via INTRAVENOUS

## 2014-09-06 MED ORDER — OXYBUTYNIN CHLORIDE 5 MG PO TABS
5.0000 mg | ORAL_TABLET | Freq: Once | ORAL | Status: DC
  Administered 2014-09-06: 5 mg via ORAL
  Filled 2014-09-06: qty 1

## 2014-09-06 MED ORDER — HYDROCODONE-ACETAMINOPHEN 5-325 MG PO TABS
2.0000 | ORAL_TABLET | Freq: Once | ORAL | Status: AC
  Administered 2014-09-06: 2 via ORAL
  Filled 2014-09-06: qty 2

## 2014-09-06 MED ORDER — SODIUM CHLORIDE 0.9 % IV BOLUS (SEPSIS)
1000.0000 mL | Freq: Once | INTRAVENOUS | Status: AC
Start: 2014-09-06 — End: 2014-09-06
  Administered 2014-09-06: 1000 mL via INTRAVENOUS

## 2014-09-06 MED ORDER — TAMSULOSIN HCL 0.4 MG PO CAPS: 0.4000 mg | ORAL_CAPSULE | Freq: Every day | ORAL | Status: AC

## 2014-09-06 NOTE — ED Notes (Signed)
Pt sts foley cath was removed at 9:20 this morning and when he tried to urinate after that "there was bright red blood coming from my penis." Bladder scanner shows 130 ml.

## 2014-09-06 NOTE — ED Notes (Signed)
Per EMS pt coming from home with c/o hematuria after having foley d/c'd earlier this morning at [facility], denies pain.

## 2014-09-06 NOTE — ED Notes (Signed)
Bed: [id number] Expected date:  Expected time:  Means of arrival:  Comments: Bleeding penis

## 2014-09-06 NOTE — ED Notes (Signed)
Pt also endorses left elbow pain, s/p fall last week. He sts he is having difficult time moving his fingers. Pulses are palpable, strong.

## 2014-09-06 NOTE — ED Notes (Signed)
Bladder scan completed with results of 565 ml

## 2014-09-06 NOTE — ED Provider Notes (Signed)
TIME SEEN: 12:45 PM  CHIEF COMPLAINT: Suprapubic pain, hematuria, left elbow pain  HPI: Pt is a 54 y.o. male with history of hypertension who presents to the emergency department with complaints of suprapubic pain and hematuria. He was seen in the emergency department on 5/5 with urinary retention and had a coude Foley catheter placed by Dr. Isabel CapriceGrapey with urology.  He was seen in the urology clinic office today by nurse practitioner and had his Foley catheter removed. He states when he went home he noticed blood at the urethral meatus and has been pain blood. He is having suprapubic pain. States he called EMS immediately when he saw the blood. No testicular pain or swelling. No fever, chills, nausea, vomiting or diarrhea. Not on anticoagulation. No back pain. No numbness, tingling or focal weakness.   Patient also reports that he had a fall sometime last week where he hurt his elbow. He is unable to tell me how or why he fell. Denies any head injury or other pain.  ROS: See HPI Constitutional: no fever  Eyes: no drainage  ENT: no runny nose   Cardiovascular:  no chest pain  Resp: no SOB  GI: no vomiting GU: no dysuria Integumentary: no rash  Allergy: no hives  Musculoskeletal: no leg swelling  Neurological: no slurred speech ROS otherwise negative  PAST MEDICAL HISTORY/PAST SURGICAL HISTORY:  Past Medical History  Diagnosis Date  . Hypertension   . Arthritis   . GERD (gastroesophageal reflux disease)     MEDICATIONS:  Prior to Admission medications   Medication Sig Start Date End Date Taking? Authorizing Provider  ciprofloxacin (CIPRO) 500 MG tablet Take 1 tablet (500 mg total) by mouth 2 (two) times daily. 09/01/14   Arby BarretteMarcy Pfeiffer, MD  fluticasone (FLONASE) 50 MCG/ACT nasal spray Place into both nostrils daily.    Historical Provider, MD  HYDROcodone-acetaminophen (NORCO/VICODIN) 5-325 MG per tablet Take 1 tablet by mouth 2 (two) times daily as needed for severe pain. 06/28/14    Tomasita CrumbleAdeleke Oni, MD  lisinopril (PRINIVIL,ZESTRIL) 10 MG tablet Take 10 mg by mouth daily.    Historical Provider, MD  meloxicam (MOBIC) 15 MG tablet Take 15 mg by mouth daily.    Historical Provider, MD  omeprazole (PRILOSEC) 40 MG capsule Take 40 mg by mouth daily.    Historical Provider, MD  tamsulosin (FLOMAX) 0.4 MG CAPS capsule Take 1 capsule (0.4 mg total) by mouth daily. 09/01/14   Arby BarretteMarcy Pfeiffer, MD    ALLERGIES:  Allergies  Allergen Reactions  . Celebrex [Celecoxib] Itching  . Ibuprofen Other (See Comments)    Upset stomach    SOCIAL HISTORY:  History  Substance Use Topics  . Smoking status: Current Every Day Smoker -- 0.50 packs/day  . Smokeless tobacco: Not on file  . Alcohol Use: Yes    FAMILY HISTORY: No family history on file.  EXAM: BP 105/60 mmHg  Pulse 76  Temp(Src) 98.3 F (36.8 C) (Oral)  Resp 18  SpO2 98% CONSTITUTIONAL: Alert and oriented and responds appropriately to questions. Well-appearing; well-nourished, in no distress HEAD: Normocephalic EYES: Conjunctivae clear, PERRL, atraumatic ENT: normal nose; no rhinorrhea; moist mucous membranes; pharynx without lesions noted NECK: Supple, no meningismus, no LAD, no midline spinal tenderness or step-off or deformity  CARD: RRR; S1 and S2 appreciated; no murmurs, no clicks, no rubs, no gallops RESP: Normal chest excursion without splinting or tachypnea; breath sounds clear and equal bilaterally; no wheezes, no rhonchi, no rales, no hypoxia or respiratory distress, speaking  full sentences ABD/GI: Normal bowel sounds; non-distended; soft, patient has some mild suprapubic tenderness but no obvious distention, no rebound, no guarding, no peritoneal signs GU: Blood at the urethral meatus, nose testicular pain or swelling. It testicular masses. No scrotal masses. 2+ femoral pulses bilaterally. Circumcised. BACK:  The back appears normal and is non-tender to palpation, there is no CVA tenderness, no midline spinal  tenderness or step-off or deformity EXT: Tenderness diffusely over the left elbow but normal range of motion in this joint and no bony deformity, compartments are soft, Normal ROM in all joints; otherwise extremities are non-tender to palpation; no edema; normal capillary refill; no cyanosis, no calf tenderness or swelling    SKIN: Normal color for age and race; warm NEURO: Moves all extremities equally, sensation to light touch intact diffusely, cranial nerves II through XII intact PSYCH: The patient's mood and manner are appropriate. Grooming and personal hygiene are appropriate.  MEDICAL DECISION MAKING: Patient here with hematuria likely from having recent Foley catheter removed. Bladder scan reveals 130 mL.  Discussed with Dr. Berneice HeinrichManny with Urology.  He recommends hydrating the patient and continuing to observe him until he is able to urinate. If he is not able to urinate and developed significant urinary retention he will see the patient in consult. We'll give the patient IV fluids, oral fluids. Will provide pain medication. He recommends avoiding Ditropan.  As for patient's left elbow pain, will obtain x-ray. Neurovascularly intact on exam and no other sign of trauma.  ED PROGRESS: 3:30 PM  Pt's left elbow x-ray shows no acute abnormalities. He has arthritis.  3:34 PM  Patient able to urinate approximately 25 mL of urine that is dark yellow.  He still feels like he is unable to empty his bladder. Bladder scan now shows 259 mL.   5:00 PM  Pt still unable to urinate after 2L IVF and drinking water.  Has only been able to urinate approximately 25 mL around 3:30 today. Repeat bladder scan shows greater than 500 mL and he is very uncomfortable. We'll attempt to place coude catheter.  5:45 PM  Coude catheter placed by nurse in ED.  Has to be inserted all the way to the bifurcation of the catheter given the length of patient's urethra. He is now draining large amount of urine and feels better. Will  update urology but I feel he is safe to be discharged home with catheter in place with outpatient urology follow-up. Urine shows hematuria and small leukocytes but rare bacteria. Culture pending. I do not feel he needs to be started on antibiotics at this time. Discussed return precautions. He verbalized understanding and is comfortable with plan.  Layla MawKristen N Selby Slovacek, DO 09/06/14 1744

## 2014-09-06 NOTE — Discharge Instructions (Signed)
Benign Prostatic Hyperplasia An enlarged prostate (benign prostatic hyperplasia) is common in older men. You may experience the following:  Weak urine stream.  Dribbling.  Feeling like the bladder has not emptied completely.  Difficulty starting urination.  Getting up frequently at night to urinate.  Urinating more frequently during the day. HOME CARE INSTRUCTIONS  Monitor your prostatic hyperplasia for any changes. The following actions may help to alleviate any discomfort you are experiencing: 1. Give yourself time when you urinate. 2. Stay away from alcohol. 3. Avoid beverages containing caffeine, such as coffee, tea, and colas, because they can make the problem worse. 4. Avoid decongestants, antihistamines, and some prescription medicines that can make the problem worse. 5. Follow up with your health care provider for further treatment as recommended. SEEK MEDICAL CARE IF: 1. You are experiencing progressive difficulty voiding. 2. Your urine stream is progressively getting narrower. 3. You are awaking from sleep with the urge to void more frequently. 4. You are constantly feeling the need to void. 5. You experience loss of urine, especially in small amounts. SEEK IMMEDIATE MEDICAL CARE IF:  1. You develop increased pain with urination or are unable to urinate. 2. You develop severe abdominal pain, vomiting, a high fever, or fainting. 3. You develop back pain or blood in your urine. MAKE SURE YOU:  1. Understand these instructions. 2. Will watch your condition. 3. Will get help right away if you are not doing well or get worse. Document Released: 04/15/2005 Document Revised: 12/16/2012 Document Reviewed: 09/15/2012 [facility] Patient Information 2015 [place], [place]. This information is not intended to replace advice given to you by your health care provider. Make sure you discuss any questions you have with your health care provider.  Acute Urinary Retention Acute urinary  retention is the temporary inability to urinate. This is a common problem in older men. As men age their prostates become larger and block the flow of urine from the bladder. This is usually a problem that has come on gradually.  HOME CARE INSTRUCTIONS If you are sent home with a Foley catheter and a drainage system, you will need to discuss the best course of action with your health care provider. While the catheter is in, maintain a good intake of fluids. Keep the drainage bag emptied and lower than your catheter. This is so that contaminated urine will not flow back into your bladder, which could lead to a urinary tract infection. There are two main types of drainage bags. One is a large bag that usually is used at night. It has a good capacity that will allow you to sleep through the night without having to empty it. The second type is called a leg bag. It has a smaller capacity, so it needs to be emptied more frequently. However, the main advantage is that it can be attached by a leg strap and can go underneath your clothing, allowing you the freedom to move about or leave your home. Only take over-the-counter or prescription medicines for pain, discomfort, or fever as directed by your health care provider.  SEEK MEDICAL CARE IF:  You develop a low-grade fever.  You experience spasms or leakage of urine with the spasms. SEEK IMMEDIATE MEDICAL CARE IF:  6. You develop chills or fever. 7. Your catheter stops draining urine. 8. Your catheter falls out. 9. You start to develop increased bleeding that does not respond to rest and increased fluid intake. MAKE SURE YOU: 6. Understand these instructions. 7. Will watch your condition.  8. Will get help right away if you are not doing well or get worse. Document Released: 07/22/2000 Document Revised: 04/20/2013 Document Reviewed: 09/24/2012 [facility] Patient Information 2015 [place], [place]. This information is not intended to replace advice given to  you by your health care provider. Make sure you discuss any questions you have with your health care provider. Foley Catheter Care A Foley catheter is a soft, flexible tube that is placed into the bladder to drain urine. A Foley catheter may be inserted if:  You leak urine or are not able to control when you urinate (urinary incontinence).  You are not able to urinate when you need to (urinary retention).  You had prostate surgery or surgery on the genitals.  You have certain medical conditions, such as multiple sclerosis, dementia, or a spinal cord injury. If you are going home with a Foley catheter in place, follow the instructions below. TAKING CARE OF THE CATHETER 10. Wash your hands with soap and water. 11. Using mild soap and warm water on a clean washcloth:  Clean the area on your body closest to the catheter insertion site using a circular motion, moving away from the catheter. Never wipe toward the catheter because this could sweep bacteria up into the urethra and cause infection.  Remove all traces of soap. Pat the area dry with a clean towel. For males, reposition the foreskin. 12. Attach the catheter to your leg so there is no tension on the catheter. Use adhesive tape or a leg strap. If you are using adhesive tape, remove any sticky residue left behind by the previous tape you used. 13. Keep the drainage bag below the level of the bladder, but keep it off the floor. 14. Check throughout the day to be sure the catheter is working and urine is draining freely. Make sure the tubing does not become kinked. 15. Do not pull on the catheter or try to remove it. Pulling could damage internal tissues. TAKING CARE OF THE DRAINAGE BAGS You will be given two drainage bags to take home. One is a large overnight drainage bag, and the other is a smaller leg bag that fits underneath clothing. You may wear the overnight bag at any time, but you should never wear the smaller leg bag at night.  Follow the instructions below for how to empty, change, and clean your drainage bags. Emptying the Drainage Bag You must empty your drainage bag when it is  - full or at least 2-3 times a day. 9. Wash your hands with soap and water. 10. Keep the drainage bag below your hips, below the level of your bladder. This stops urine from going back into the tubing and into your bladder. 11. Hold the dirty bag over the toilet or a clean container. 12. Open the pour spout at the bottom of the bag and empty the urine into the toilet or container. Do not let the pour spout touch the toilet, container, or any other surface. Doing so can place bacteria on the bag, which can cause an infection. 13. Clean the pour spout with a gauze pad or cotton ball that has rubbing alcohol on it. 14. Close the pour spout. 15. Attach the bag to your leg with adhesive tape or a leg strap. 16. Wash your hands well. Changing the Drainage Bag Change your drainage bag once a month or sooner if it starts to smell bad or look dirty. Below are steps to follow when changing the drainage bag. 4. Wash  your hands with soap and water. 5. Pinch off the rubber catheter so that urine does not spill out. 6. Disconnect the catheter tube from the drainage tube at the connection valve. Do not let the tubes touch any surface. 7. Clean the end of the catheter tube with an alcohol wipe. Use a different alcohol wipe to clean the end of the drainage tube. 8. Connect the catheter tube to the drainage tube of the clean drainage bag. 9. Attach the new bag to the leg with adhesive tape or a leg strap. Avoid attaching the new bag too tightly. 10. Wash your hands well. Cleaning the Drainage Bag 4. Wash your hands with soap and water. 5. Wash the bag in warm, soapy water. 6. Rinse the bag thoroughly with warm water. 7. Fill the bag with a solution of white vinegar and water (1 cup vinegar to 1 qt warm water [.2 L vinegar to 1 L warm water]). Close the  bag and soak it for 30 minutes in the solution. 8. Rinse the bag with warm water. 9. Hang the bag to dry with the pour spout open and hanging downward. 10. Store the clean bag (once it is dry) in a clean plastic bag. 11. Wash your hands well. PREVENTING INFECTION  Wash your hands before and after handling your catheter.  Take showers daily and wash the area where the catheter enters your body. Do not take baths. Replace wet leg straps with dry ones, if this applies.  Do not use powders, sprays, or lotions on the genital area. Only use creams, lotions, or ointments as directed by your caregiver.  For females, wipe from front to back after each bowel movement.  Drink enough fluids to keep your urine clear or pale yellow unless you have a fluid restriction.  Do not let the drainage bag or tubing touch or lie on the floor.  Wear cotton underwear to absorb moisture and to keep your skin drier. SEEK MEDICAL CARE IF:   Your urine is cloudy or smells unusually bad.  Your catheter becomes clogged.  You are not draining urine into the bag or your bladder feels full.  Your catheter starts to leak. SEEK IMMEDIATE MEDICAL CARE IF:   You have pain, swelling, redness, or pus where the catheter enters the body.  You have pain in the abdomen, legs, lower back, or bladder.  You have a fever.  You see blood fill the catheter, or your urine is pink or red.  You have nausea, vomiting, or chills.  Your catheter gets pulled out. MAKE SURE YOU:   Understand these instructions.  Will watch your condition.  Will get help right away if you are not doing well or get worse. Document Released: 04/15/2005 Document Revised: 08/30/2013 Document Reviewed: 04/06/2012 [facility] Patient Information 2015 [place], [place]. This information is not intended to replace advice given to you by your health care provider. Make sure you discuss any questions you have with your health care provider.

## 2014-09-06 NOTE — ED Notes (Signed)
Bladder scan resulted in 

## 2014-09-08 LAB — URINE CULTURE
Colony Count: NO GROWTH
Culture: NO GROWTH

## 2014-10-22 ENCOUNTER — Emergency Department (HOSPITAL_COMMUNITY): Payer: Medicare Other

## 2014-10-22 ENCOUNTER — Encounter (HOSPITAL_COMMUNITY): Payer: Self-pay | Admitting: *Deleted

## 2014-10-22 ENCOUNTER — Emergency Department (HOSPITAL_COMMUNITY)
Admission: EM | Admit: 2014-10-22 | Discharge: 2014-10-22 | Disposition: A | Discharge status: Home / Self Care | Payer: Medicare Other | Attending: Emergency Medicine | Admitting: Emergency Medicine

## 2014-10-22 DIAGNOSIS — Z720 Tobacco use: Secondary | ICD-10-CM | POA: Insufficient documentation

## 2014-10-22 DIAGNOSIS — K219 Gastro-esophageal reflux disease without esophagitis: Secondary | ICD-10-CM | POA: Diagnosis not present

## 2014-10-22 DIAGNOSIS — Y9301 Activity, walking, marching and hiking: Secondary | ICD-10-CM | POA: Diagnosis not present

## 2014-10-22 DIAGNOSIS — S3992XA Unspecified injury of lower back, initial encounter: Secondary | ICD-10-CM | POA: Insufficient documentation

## 2014-10-22 DIAGNOSIS — M549 Dorsalgia, unspecified: Secondary | ICD-10-CM

## 2014-10-22 DIAGNOSIS — Z791 Long term (current) use of non-steroidal anti-inflammatories (NSAID): Secondary | ICD-10-CM | POA: Insufficient documentation

## 2014-10-22 DIAGNOSIS — S24109A Unspecified injury at unspecified level of thoracic spinal cord, initial encounter: Secondary | ICD-10-CM | POA: Diagnosis not present

## 2014-10-22 DIAGNOSIS — W1839XA Other fall on same level, initial encounter: Secondary | ICD-10-CM | POA: Insufficient documentation

## 2014-10-22 DIAGNOSIS — Y998 Other external cause status: Secondary | ICD-10-CM | POA: Insufficient documentation

## 2014-10-22 DIAGNOSIS — Z792 Long term (current) use of antibiotics: Secondary | ICD-10-CM | POA: Diagnosis not present

## 2014-10-22 DIAGNOSIS — M1990 Unspecified osteoarthritis, unspecified site: Secondary | ICD-10-CM | POA: Insufficient documentation

## 2014-10-22 DIAGNOSIS — Y929 Unspecified place or not applicable: Secondary | ICD-10-CM | POA: Insufficient documentation

## 2014-10-22 DIAGNOSIS — M545 Low back pain, unspecified: Secondary | ICD-10-CM

## 2014-10-22 DIAGNOSIS — I10 Essential (primary) hypertension: Secondary | ICD-10-CM | POA: Insufficient documentation

## 2014-10-22 DIAGNOSIS — W19XXXA Unspecified fall, initial encounter: Secondary | ICD-10-CM

## 2014-10-22 DIAGNOSIS — Z79899 Other long term (current) drug therapy: Secondary | ICD-10-CM | POA: Insufficient documentation

## 2014-10-22 MED ORDER — OXYCODONE-ACETAMINOPHEN 5-325 MG PO TABS
1.0000 | ORAL_TABLET | Freq: Once | ORAL | Status: AC
  Administered 2014-10-22: 1 via ORAL
  Filled 2014-10-22: qty 1

## 2014-10-22 MED ORDER — DIAZEPAM 5 MG PO TABS: 5.0000 mg | ORAL_TABLET | Freq: Two times a day (BID) | ORAL | Status: AC | PRN

## 2014-10-22 MED ORDER — DIAZEPAM 5 MG PO TABS
5.0000 mg | ORAL_TABLET | Freq: Once | ORAL | Status: AC
  Administered 2014-10-22: 5 mg via ORAL
  Filled 2014-10-22: qty 1

## 2014-10-22 MED ORDER — TRAMADOL HCL 50 MG PO TABS: 50.0000 mg | ORAL_TABLET | Freq: Four times a day (QID) | ORAL | Status: AC | PRN

## 2014-10-22 NOTE — ED Notes (Signed)
Remains in radiology.

## 2014-10-22 NOTE — ED Notes (Signed)
Pt in via EMS after a fall, pt was walking and left knee gave out, history of same, pt landed on the ground, denies hitting head or LOC, c/o lower back pain, history of same, no distress noted

## 2014-10-22 NOTE — Discharge Instructions (Signed)
Take Valium as needed as directed for muscle spasm. No driving or operating heavy machinery while taking valium. This medication may cause drowsiness. Take tramadol as prescribed. Rest, apply ice intermittently for the next 24 hours followed by heat. Avoid heavy lifting or hard physical activity.  Back Pain, Adult Low back pain is very common. About 1 in 5 people have back pain.The cause of low back pain is rarely dangerous. The pain often gets better over time.About half of people with a sudden onset of back pain feel better in just 2 weeks. About 8 in 10 people feel better by 6 weeks.  CAUSES Some common causes of back pain include:  Strain of the muscles or ligaments supporting the spine.  Wear and tear (degeneration) of the spinal discs.  Arthritis.  Direct injury to the back. DIAGNOSIS Most of the time, the direct cause of low back pain is not known.However, back pain can be treated effectively even when the exact cause of the pain is unknown.Answering your caregiver's questions about your overall health and symptoms is one of the most accurate ways to make sure the cause of your pain is not dangerous. If your caregiver needs more information, he or she may order lab work or imaging tests (X-rays or MRIs).However, even if imaging tests show changes in your back, this usually does not require surgery. HOME CARE INSTRUCTIONS For many people, back pain returns.Since low back pain is rarely dangerous, it is often a condition that people can learn to [facility] their own.   Remain active. It is stressful on the back to sit or stand in one place. Do not sit, drive, or stand in one place for more than 30 minutes at a time. Take short walks on level surfaces as soon as pain allows.Try to increase the length of time you walk each day.  Do not stay in bed.Resting more than 1 or 2 days can delay your recovery.  Do not avoid exercise or work.Your body is made to move.It is not dangerous to  be active, even though your back may hurt.Your back will likely heal faster if you return to being active before your pain is gone.  Pay attention to your body when you bend and lift. Many people have less discomfortwhen lifting if they bend their knees, keep the load close to their bodies,and avoid twisting. Often, the most comfortable positions are those that put less stress on your recovering back.  Find a comfortable position to sleep. Use a firm mattress and lie on your side with your knees slightly bent. If you lie on your back, put a pillow under your knees.  Only take over-the-counter or prescription medicines as directed by your caregiver. Over-the-counter medicines to reduce pain and inflammation are often the most helpful.Your caregiver may prescribe muscle relaxant drugs.These medicines help dull your pain so you can more quickly return to your normal activities and healthy exercise.  Put ice on the injured area.  Put ice in a plastic bag.  Place a towel between your skin and the bag.  Leave the ice on for 15-20 minutes, 03-04 times a day for the first 2 to 3 days. After that, ice and heat may be alternated to reduce pain and spasms.  Ask your caregiver about trying back exercises and gentle massage. This may be of some benefit.  Avoid feeling anxious or stressed.Stress increases muscle tension and can worsen back pain.It is important to recognize when you are anxious or stressed and learn ways  to manage it.Exercise is a great option. SEEK MEDICAL CARE IF:  You have pain that is not relieved with rest or medicine.  You have pain that does not improve in 1 week.  You have new symptoms.  You are generally not feeling well. SEEK IMMEDIATE MEDICAL CARE IF:   You have pain that radiates from your back into your legs.  You develop new bowel or bladder control problems.  You have unusual weakness or numbness in your arms or legs.  You develop nausea or  vomiting.  You develop abdominal pain.  You feel faint. Document Released: 04/15/2005 Document Revised: 10/15/2011 Document Reviewed: 08/17/2013 [facility] Patient Information 2015 [place], [place]. This information is not intended to replace advice given to you by your health care provider. Make sure you discuss any questions you have with your health care provider.

## 2014-10-22 NOTE — ED Provider Notes (Signed)
CSN: [id number]     Arrival date & time 10/22/14  1724 History   First MD Initiated Contact with Patient 10/22/14 1728     Chief Complaint  Patient presents with  . Fall     (Consider location/radiation/quality/duration/timing/severity/associated sxs/prior Treatment) HPI Comments: 54 year old male presenting with back pain after a fall this evening. Patient states he normally ambulates with a cane, however when outside without his cane, his left knee gave out causing him to fall. No head injury or loss of consciousness. Reports mid to lower back pain, described as "just pain", 9/10, nonradiating. Pain worse with movement. No alleviating factors. Has not tried any medications for his symptoms. Denies pain, numbness or tingling into her extremities. No loss of control of bowels or bladder or saddle anesthesia. History of bilateral knee replacements, and states his left knee has been causing him some problems lately (replaced in 2013 in [facility]). His PCP is aware of this and he is trying to get in with orthopedics.  Patient is a 54 y.o. male presenting with fall. The history is provided by the patient and the EMS personnel.  Fall    Past Medical History  Diagnosis Date  . Hypertension   . Arthritis   . GERD (gastroesophageal reflux disease)    Past Surgical History  Procedure Laterality Date  . Joint replacement     History reviewed. No pertinent family history. History  Substance Use Topics  . Smoking status: Current Every Day Smoker -- 0.50 packs/day  . Smokeless tobacco: Not on file  . Alcohol Use: Yes    Review of Systems  Musculoskeletal: Positive for back pain.       + L knee pain, no different than over the past 2 weeks.  All other systems reviewed and are negative.     Allergies  Celebrex and Ibuprofen  Home Medications   Prior to Admission medications   Medication Sig Start Date End Date Taking? Authorizing Provider  ciprofloxacin (CIPRO) 500 MG tablet Take 1 tablet  (500 mg total) by mouth 2 (two) times daily. 09/01/14   Arby Barrette, MD  diazepam (VALIUM) 5 MG tablet Take 1 tablet (5 mg total) by mouth every 12 (twelve) hours as needed for muscle spasms. 10/22/14   Shavon Ashmore M [place] Spees, PA-C  fluticasone (FLONASE) 50 MCG/ACT nasal spray Place 1 spray into both nostrils daily as needed for allergies.     Historical Provider, MD  HYDROcodone-acetaminophen (NORCO/VICODIN) 5-325 MG per tablet Take 1 tablet by mouth 2 (two) times daily as needed for severe pain. Patient not taking: Reported on 09/06/2014 06/28/14   Tomasita Crumble, MD  lisinopril (PRINIVIL,ZESTRIL) 10 MG tablet Take 10 mg by mouth daily.    Historical Provider, MD  meloxicam (MOBIC) 15 MG tablet Take 15 mg by mouth daily.    Historical Provider, MD  omeprazole (PRILOSEC) 40 MG capsule Take 40 mg by mouth daily.    Historical Provider, MD  tamsulosin (FLOMAX) 0.4 MG CAPS capsule Take 1 capsule (0.4 mg total) by mouth daily. 09/01/14   Arby Barrette, MD  tamsulosin (FLOMAX) 0.4 MG CAPS capsule Take 1 capsule (0.4 mg total) by mouth daily. 09/06/14   Kristen N Ward, DO  traMADol (ULTRAM) 50 MG tablet Take 1 tablet (50 mg total) by mouth every 6 (six) hours as needed. 10/22/14   Shadonna Benedick M Caroly Purewal, PA-C   BP 135/81 mmHg  Pulse 53  Temp(Src) 97.9 F (36.6 C) (Oral)  Resp 16  SpO2 100% Physical Exam  Constitutional:  He is oriented to person, place, and time. He appears well-developed and well-nourished. No distress.  HENT:  Head: Normocephalic and atraumatic.  Mouth/Throat: Oropharynx is clear and moist.  Eyes: Conjunctivae are normal.  Neck: Normal range of motion. Neck supple. No spinous process tenderness and no muscular tenderness present.  Cardiovascular: Normal rate, regular rhythm and normal heart sounds.   Pulmonary/Chest: Effort normal and breath sounds normal. No respiratory distress.  Musculoskeletal:  TTP lower T-spine and upper L-spine and mildly in paraspinal muscles. No edema or step-off. Pain increased  when sitting upright. BL linear surgical scars on knees. L knee mildly swollen, no erythema or warmth, no change with fall, has been present per pt.  Neurological: He is alert and oriented to person, place, and time. He has normal strength.  Strength lower extremities 5/5 and equal bilateral. Sensation intact. Gait not assessed on initial exam.  Skin: Skin is warm and dry. No rash noted. He is not diaphoretic.  Psychiatric: He has a normal mood and affect. His behavior is normal.  Nursing note and vitals reviewed.   ED Course  Procedures (including critical care time) Labs Review Labs Reviewed - No data to display  Imaging Review Dg Thoracic Spine 2 View  10/22/2014   CLINICAL DATA:  Fall, back pain  EXAM: THORACIC SPINE - 2 VIEW  COMPARISON:  None.  FINDINGS: Normal thoracic kyphosis.  No evidence of fracture or dislocation. Vertebral body heights are maintained.  Mild degenerative changes of the mid/lower thoracic spine.  Visualized lungs are clear.  IMPRESSION: No fracture or dislocation is seen.  Mild degenerative changes.   Electronically Signed   By: Charline Bills M.D.   On: 10/22/2014 18:54   Dg Lumbar Spine Complete  10/22/2014   CLINICAL DATA:  Fall, low back pain  EXAM: LUMBAR SPINE - COMPLETE 4+ VIEW  COMPARISON:  CT abdomen pelvis dated 09/01/2014  FINDINGS: Five lumbar type vertebral bodies.  Normal lumbar lordosis.  No evidence of fracture or dislocation. Vertebral body heights are maintained.  Moderate degenerative changes of the lower lumbar spine.  Visualized bony pelvis appears intact.  IMPRESSION: No fracture or dislocation is seen.  Moderate degenerative changes of the lower lumbar spine.   Electronically Signed   By: Charline Bills M.D.   On: 10/22/2014 18:54     EKG Interpretation None      MDM   Final diagnoses:  Fall, initial encounter  Mid back pain  Midline low back pain without sciatica   Non-toxic appearing, NAD. AFVSS. No red flags concerning  patient's back pain. No s/s of central cord compression or cauda equina. Lower extremities are neurovascularly intact. Pain after mechanical fall. No focal neuro deficits. Xrays negative. After percocet and valium, pt able to ambulate without difficulty. Will d/c home with rx for valium and tramadol. F/u with PCP. Stable for d/c. Return precautions given. Patient states understanding of treatment care plan and is agreeable.  Kathrynn Speed, PA-C 10/22/14 1931  Purvis Sheffield, MD 10/23/14 1137

## 2015-02-13 DIAGNOSIS — M130 Polyarthritis, unspecified: Secondary | ICD-10-CM | POA: Diagnosis not present

## 2015-02-13 DIAGNOSIS — I10 Essential (primary) hypertension: Secondary | ICD-10-CM | POA: Diagnosis not present

## 2015-02-13 DIAGNOSIS — E6609 Other obesity due to excess calories: Secondary | ICD-10-CM | POA: Diagnosis not present

## 2015-02-13 DIAGNOSIS — R7309 Other abnormal glucose: Secondary | ICD-10-CM | POA: Diagnosis not present

## 2015-02-13 DIAGNOSIS — Z6829 Body mass index (BMI) 29.0-29.9, adult: Secondary | ICD-10-CM | POA: Diagnosis not present

## 2015-02-17 DIAGNOSIS — M25562 Pain in left knee: Secondary | ICD-10-CM | POA: Diagnosis not present

## 2015-02-17 DIAGNOSIS — M25511 Pain in right shoulder: Secondary | ICD-10-CM | POA: Diagnosis not present

## 2015-02-17 DIAGNOSIS — M545 Low back pain: Secondary | ICD-10-CM | POA: Diagnosis not present

## 2015-02-24 DIAGNOSIS — M25511 Pain in right shoulder: Secondary | ICD-10-CM | POA: Diagnosis not present

## 2015-03-29 DIAGNOSIS — Z1389 Encounter for screening for other disorder: Secondary | ICD-10-CM | POA: Diagnosis not present

## 2015-03-29 DIAGNOSIS — I10 Essential (primary) hypertension: Secondary | ICD-10-CM | POA: Diagnosis not present

## 2015-03-29 DIAGNOSIS — E669 Obesity, unspecified: Secondary | ICD-10-CM | POA: Diagnosis not present

## 2015-03-29 DIAGNOSIS — R972 Elevated prostate specific antigen [PSA]: Secondary | ICD-10-CM | POA: Diagnosis not present

## 2015-03-29 DIAGNOSIS — Z96653 Presence of artificial knee joint, bilateral: Secondary | ICD-10-CM | POA: Diagnosis not present

## 2021-12-11 ENCOUNTER — Inpatient Hospital Stay: Admit: 2021-12-11 | Discharge: 2021-12-12 | Disposition: A | Discharge status: Home / Self Care | Payer: MEDICARE

## 2021-12-11 DIAGNOSIS — L02416 Cutaneous abscess of left lower limb: Secondary | ICD-10-CM

## 2021-12-11 NOTE — ED Provider Notes (Signed)
[facility] EMERGENCY DEPT  EMERGENCY DEPARTMENT ENCOUNTER      Pt Name: Jermaine Perez.  MRN: [id number]  Birthdate 11-01-1960  Date of evaluation: 12/11/2021  Provider: Vanice Sarah, PA-C    CHIEF COMPLAINT       Chief Complaint   Patient presents with    Insect Bite     Patient comes to ER for possible insect bite to his left leg that he first noticed 4 days ago.         HISTORY OF PRESENT ILLNESS   (Location/Symptom, Timing/Onset, Context/Setting, Quality, Duration, Modifying Factors, Severity)  Note limiting factors.   Jermaine Perez. is a 61 y.o. male who presents to the emergency department      61 y/o AAM here with concerns of a possible infected insect bite.  Patient developed itching and redness to his left thigh 4 days ago.  He developed increased swelling to the area and drainage today.  He is concerned of a potential spider bite.  Denies fever.     The history is provided by the patient.     Nursing Notes were reviewed.    REVIEW OF SYSTEMS    (2-9 systems for level 4, 10 or more for level 5)     Review of Systems   Constitutional: Negative.    HENT: Negative.     Respiratory: Negative.     Gastrointestinal: Negative.    Skin:  Positive for wound.   Neurological: Negative.    Psychiatric/Behavioral: Negative.       Except as noted above the remainder of the review of systems was reviewed and negative.       PAST MEDICAL HISTORY   No past medical history on file.      SURGICAL HISTORY     No past surgical history on file.      CURRENT MEDICATIONS       Discharge Medication List as of 12/11/2021  9:57 PM          ALLERGIES     Ibuprofen    FAMILY HISTORY     No family history on file.       SOCIAL HISTORY       Social History     Socioeconomic History    Marital status: Single       SCREENINGS         Glasgow Coma Scale  Eye Opening: Spontaneous  Best Verbal Response: Oriented  Best Motor Response: Obeys commands  Glasgow Coma Scale Score: 15                     CIWA Assessment  BP: 126/82  Pulse: 76                  PHYSICAL EXAM    (up to 7 for level 4, 8 or more for level 5)     ED Triage Vitals [12/11/21 2025]   BP Temp Temp Source Pulse Respirations SpO2 Height Weight - Scale   126/82 99.4 F (37.4 C) Oral 76 16 99 % 6\' 7"  (2.007 m) 241 lb (109.3 kg)       Physical Exam  Vitals and nursing note reviewed.   Constitutional:       Appearance: Normal appearance.   HENT:      Head: Normocephalic and atraumatic.   Pulmonary:      Effort: Pulmonary effort is normal.   Skin:     Comments: Draining abscess to  the medial aspect of the distal left thigh   Neurological:      Mental Status: He is oriented to person, place, and time.   Psychiatric:         Mood and Affect: Mood normal.         Behavior: Behavior normal.         EMERGENCY DEPARTMENT COURSE and DIFFERENTIAL DIAGNOSIS/MDM:   Vitals:    Vitals:    12/11/21 2025   BP: 126/82   Pulse: 76   Resp: 16   Temp: 99.4 F (37.4 C)   TempSrc: Oral   SpO2: 99%   Weight: 109.3 kg   Height: 6\' 7"  (2.007 m)           Medical Decision Making  We will start patient on oral antibiotics (Augmentin and Bactrim DS). Advised re-evaluation within 2 to 3 days, sooner if symptoms worsen    Risk  Prescription drug management.          FINAL IMPRESSION      1. Abscess of left thigh          DISPOSITION/PLAN   DISPOSITION Decision To Discharge 12/11/2021 09:56:12 PM      PATIENT REFERRED TO:  [name]  [address]  [place]  [phone]  In 3 days  For wound re-check      DISCHARGE MEDICATIONS:  Discharge Medication List as of 12/11/2021  9:57 PM        START taking these medications    Details   amoxicillin-clavulanate (AUGMENTIN) 875-125 MG per tablet Take 1 tablet by mouth 2 times daily for 10 days, Disp-20 tablet, R-0Print      sulfamethoxazole-trimethoprim (BACTRIM DS;SEPTRA DS) 800-160 MG per tablet Take 1 tablet by mouth 2 times daily for 10 days, Disp-20 tablet, R-0Print           Controlled Substances Monitoring:     No  flowsheet data found.    (Please note that portions of this note were completed with a voice recognition program.  Efforts were made to edit the dictations but occasionally words are mis-transcribed.)    Jermaine Perez 12/13/2021, PA-C (electronically signed)  Attending Emergency Physician           Jermaine Nimrod, PA-C  12/12/21 0100       12/14/21, PA-C  12/12/21 [phone]

## 2021-12-12 MED ORDER — AMOXICILLIN-POT CLAVULANATE 875-125 MG PO TABS
875-125 MG | ORAL_TABLET | Freq: Two times a day (BID) | ORAL | 0 refills | Status: AC
Start: 2021-12-12 — End: 2021-12-21

## 2021-12-12 MED ORDER — AMOXICILLIN-POT CLAVULANATE 875-125 MG PO TABS
875-125 MG | Freq: Once | ORAL | Status: AC
Start: 2021-12-12 — End: 2021-12-11

## 2021-12-12 MED ORDER — SULFAMETHOXAZOLE-TRIMETHOPRIM 800-160 MG PO TABS
800-160 MG | ORAL_TABLET | Freq: Two times a day (BID) | ORAL | 0 refills | Status: AC
Start: 2021-12-12 — End: 2021-12-21

## 2021-12-12 MED ORDER — AMOXICILLIN-POT CLAVULANATE 875-125 MG PO TABS
875-125 MG | ORAL | Status: AC
Start: 2021-12-12 — End: 2021-12-11
  Administered 2021-12-12: 02:00:00 1 via ORAL

## 2021-12-12 MED ORDER — SULFAMETHOXAZOLE-TRIMETHOPRIM 800-160 MG PO TABS
800-160 MG | Freq: Once | ORAL | Status: AC
Start: 2021-12-12 — End: 2021-12-11
  Administered 2021-12-12: 02:00:00 1 via ORAL

## 2021-12-12 MED FILL — AMOXICILLIN-POT CLAVULANATE 875-125 MG PO TABS: 875-125 MG | ORAL | Qty: 1

## 2021-12-12 MED FILL — SULFAMETHOXAZOLE-TRIMETHOPRIM 800-160 MG PO TABS: 800-160 MG | ORAL | Qty: 1

## 2022-03-07 ENCOUNTER — Emergency Department: Admit: 2022-03-07 | Payer: MEDICARE

## 2022-03-07 ENCOUNTER — Inpatient Hospital Stay
Admit: 2022-03-07 | Discharge: 2022-03-07 | Disposition: A | Discharge status: Home / Self Care | Payer: MEDICARE | Attending: Emergency Medicine

## 2022-03-07 DIAGNOSIS — S0003XA Contusion of scalp, initial encounter: Secondary | ICD-10-CM

## 2022-03-07 LAB — BASIC METABOLIC PANEL
Anion Gap: 9 mmol/L (ref 2–17)
BUN: 19 mg/dL (ref 8–23)
CO2: 26 mmol/L (ref 22–29)
Calcium: 8.6 mg/dL — ABNORMAL LOW (ref 8.8–10.2)
Chloride: 103 mmol/L (ref 98–107)
Creatinine: 0.8 mg/dL (ref 0.7–1.3)
Est, Glom Filt Rate: 99 mL/min/1.73m (ref >=60)
Glucose: 88 mg/dL (ref 70–99)
OSMOLALITY CALCULATED: 277 mOsm/kg (ref 270–287)
Potassium: 4 mmol/L (ref 3.5–5.3)
Sodium: 138 mmol/L (ref 135–145)

## 2022-03-07 LAB — CBC WITH AUTO DIFFERENTIAL
Absolute Baso #: 0 10*3/uL (ref 0.0–0.2)
Absolute Eos #: 0.2 10*3/uL (ref 0.0–0.5)
Absolute Lymph #: 1.3 10*3/uL (ref 1.0–3.2)
Absolute Mono #: 0.4 10*3/uL (ref 0.3–1.0)
Basophils %: 0.5 % (ref 0.0–2.0)
Eosinophils %: 3.9 % (ref 0.0–7.0)
Hematocrit: 34.3 % — ABNORMAL LOW (ref 38.0–52.0)
Hemoglobin: 11.8 g/dL — ABNORMAL LOW (ref 13.0–17.3)
Immature Grans (Abs): 0.01 10*3/uL (ref 0.00–0.06)
Immature Granulocytes: 0.2 % (ref 0.0–0.6)
Lymphocytes: 31.1 % (ref 15.0–45.0)
MCH: 27.2 pg (ref 27.0–34.5)
MCHC: 34.4 g/dL (ref 32.0–36.0)
MCV: 79 fL — ABNORMAL LOW (ref 84.0–100.0)
MPV: 9.3 fL (ref 7.2–13.2)
Monocytes: 9.8 % (ref 4.0–12.0)
NRBC Absolute: 0 10*3/uL (ref 0.000–0.012)
NRBC Automated: 0 % (ref 0.0–0.2)
Neutrophils %: 54.5 % (ref 42.0–74.0)
Neutrophils Absolute: 2.2 10*3/uL (ref 1.6–7.3)
Platelets: 270 10*3/uL (ref 140–440)
RBC: 4.34 x10e6/mcL (ref 4.00–5.60)
RDW: 13.9 % (ref 11.0–16.0)
WBC: 4.1 10*3/uL (ref 3.8–10.6)

## 2022-03-07 LAB — ETHANOL: Ethanol Lvl: NOT DETECTED mg/dL (ref 0.0–10.0)

## 2022-03-07 NOTE — ED Notes (Signed)
Pt in CT.      Ned Card [place], RN  03/07/22 [phone]

## 2022-03-07 NOTE — ED Provider Notes (Signed)
[facility] EMERGENCY DEPT  EMERGENCY DEPARTMENT ENCOUNTER      Pt Name: Jermaine Perez.  MRN: [id number]  Birthdate 11-02-1960  Date of evaluation: 03/07/2022  Provider: Jaci Standard, MD  Provider evaluation time: 03/07/22 1518    CHIEF COMPLAINT       Chief Complaint   Patient presents with    Fall     Pt coming from home via ems after reportedly having 3 falls since 1:45 PM. During last fall pt hit head. No LOC or blood thinners. Pt states he will be walking and just lose his balance. Per ems able to ambulate to stretcher without difficulty. Pt denies any weakness, numbness, or dizziness. Small abrasion to forehead.         HISTORY OF PRESENT ILLNESS    Patient presents to the emergency department after reported falls today.  The patient states that he fell twice, noting that both episodes were instances in which he felt like his upper body was going faster than his feet could keep up with and he fell forward.  During one of his episodes, he states that he struck his head and does have an area of swelling/hematoma on the forehead, but he denies loss of consciousness.  He denies any syncopal episode.  He denies any focal numbness/weakness, neck pain, dizziness, or any other associated symptoms or related injury.  He drinks alcohol, but denies any consumption today.  He reports that he ambulates without assistance, but has been feeling increasingly unsteady and thinks that he may benefit from a walker.    The history is provided by the patient.       Nursing Notes were reviewed.    REVIEW OF SYSTEMS     Review of Systems   Constitutional:  Negative for fever.   Eyes:  Negative for visual disturbance.   Respiratory:  Negative for shortness of breath.    Cardiovascular:  Negative for chest pain and palpitations.   Gastrointestinal:  Negative for abdominal pain, nausea and vomiting.   Musculoskeletal:  Negative for back pain and neck pain.   Skin:  Negative for rash and wound.   Neurological:  Positive for headaches  (frontal (mild) at the area of swelling/hematoma). Negative for dizziness, seizures, syncope, weakness, light-headedness and numbness.   Psychiatric/Behavioral:  Negative for confusion.    All other systems reviewed and are negative.      PAST MEDICAL & SURGICAL HISTORY   No past medical history on file.    No past surgical history on file.    CURRENT MEDICATIONS       Previous Medications    No medications on file       ALLERGIES     Celecoxib and Ibuprofen    FAMILY & SOCIAL HISTORY     No family history on file.     Social History     Socioeconomic History    Marital status: Single       PHYSICAL EXAM       ED Triage Vitals [03/07/22 1456]   BP Temp Temp Source Pulse Respirations SpO2 Height Weight - Scale   (!) 147/95 98 F (36.7 C) Oral 72 16 100 % 2.007 m (_0 ) 113.4 kg (250 lb)       Physical Exam  Vitals and nursing note reviewed.   Constitutional:       General: He is not in acute distress.     Appearance: He is not toxic-appearing.   HENT:  Head: Normocephalic and atraumatic.      Nose: Nose normal.      Comments: No septal hematoma     Mouth/Throat:      Mouth: Mucous membranes are moist.      Pharynx: Oropharynx is clear.      Comments: Airway patent  Eyes:      Extraocular Movements: Extraocular movements intact.      Pupils: Pupils are equal, round, and reactive to light.   Cardiovascular:      Rate and Rhythm: Normal rate.      Pulses: Normal pulses.      Comments: Normal peripheral perfusion  Pulmonary:      Effort: Pulmonary effort is normal. No respiratory distress.      Breath sounds: Normal breath sounds.   Abdominal:      Tenderness: There is no abdominal tenderness.   Musculoskeletal:         General: No swelling, tenderness or deformity. Normal range of motion.      Cervical back: Normal range of motion and neck supple. No rigidity or tenderness.   Skin:     General: Skin is warm and dry.      Capillary Refill: Capillary refill takes less than 2 seconds.      Findings: No rash.    Neurological:      General: No focal deficit present.      Mental Status: He is alert and oriented to person, place, and time.      Comments: Alert; Moving all extremities   Psychiatric:         Mood and Affect: Mood normal.         Behavior: Behavior normal.         Thought Content: Thought content normal.       DIAGNOSTIC RESULTS     RADIOLOGY:   CT HEAD WO CONTRAST   Final Result   No acute intracranial abnormality.         LABS:  Labs Reviewed   CBC WITH AUTO DIFFERENTIAL - Abnormal; Notable for the following components:       Result Value    Hemoglobin 11.8 (*)     Hematocrit 34.3 (*)     MCV 79.0 (*)     All other components within normal limits   BASIC METABOLIC PANEL - Abnormal; Notable for the following components:    Calcium 8.6 (*)     All other components within normal limits   ETHANOL     All other labs were within normal range or not returned as of this dictation.    ED COURSE, REASSESSMENT and MDM:     ED Course as of 03/07/22 1719   Thu Mar 07, 2022   1713 CT HEAD WO CONTRAST  No acute intracranial abnormality [DC]      ED Course User Index  [DC] Jaci Standard, MD     Medical Decision Making  The patient's work-up does not show any acute abnormality.  No serious injury was sustained during his reported falls.  The patient appears to be generally steady in the ED but, given his reported falls, plan for discharge with a walker to help with gait steadiness.  Additionally, the ED case manager has met with the patient and provided him with additional resources to assist with follow-up medical care and other needs.  I have advised him to return to the ED, as needed, for any new/worsening symptoms or further concerns.    Amount and/or  Complexity of Data Reviewed  Labs: ordered.  Radiology: ordered.      FINAL IMPRESSION      1. Contusion of scalp, initial encounter    2. Ground-level fall          DISPOSITION/PLAN   DISPOSITION Decision To Discharge 03/07/2022 05:15:09 PM      PATIENT REFERRED  TO:  [facility]  [address] [place] [address]  [phone]  Open Tuesday - Friday from [place] to the [facility] for assistance with housing resources and other assistance.    Please call [phone] to schedule a follow-up appointment with a primary care physician.    Call in 1 day  Call to schedule an appt as soon as available, for ongoing outpatient medical care.      DISCHARGE MEDICATIONS:  New Prescriptions    No medications on file       (Please note that portions of this note were completed with a voice recognition transcription program.  Efforts were made to ensure the accuracy of the dictations, but occasionally words are mis-transcribed.)    Jaci Standard, MD (electronically signed)  Emergency Medicine      Jaci Standard, MD  03/07/22 1719

## 2022-03-07 NOTE — Discharge Instructions (Addendum)
Thank you for visiting the Emergency Department today.  Your testing today does not show any serious injuries or emergency medical problems.  You have been given a walker to help steady you when walking.  Please use it to help avoid further falls.  Your visit today is just one step in your care.  It is important that you follow-up with some of the resources listed in your instructions for further assistance.  For example, [facility] can help you with housing and other needs.  Also, you may call [phone] to schedule a follow-up appointment with one of our [place] healthcare providers.    Return to the Emergency Department, as needed, for any new/worsening symptoms or other concerns.     If you do not have a primary care physician or need help finding any other type of physician, you may call [phone] for assistance.    -------------------------------------------------------------------------------    FOR HOUSING ASSISTANCE:    [facility] - [phone] - connection to housing and supportive services for those experiencing homelessness or those at-risk of homelessness.  [place] [phone]) - [place] [phone] - [facility], [place], [place] a place for homeless people in [place] and those at risk of homelessness to connect with service providers and enroll in job readiness training, housing, educational programs, Designer, fashion/clothing, Clinical cytogeneticist, health care and mental health services.  [facility] - 707-434-1964) - Homeless ministry including services for free laundry, haircuts, showers, and a hot meal in the [place], [place], [employer] areas and more.  [facility] - (828)522-0301) - Men's and Woman's separate living facilities shelter program in [place], [place] [phone]) -  housing, support, and resources to  women, especially those affected by sexual assault, human trafficking, and bouts of homelessness, with individual and shared rooms  [facility] - (516)523-4861) -  male homeless shelter affording clients safe, structured, housing and services to transition them into permanent affordable housing in [place], [place], and [place]  [facility] - 5793735333 - transitional living homes for men and women recovering from the diseases of alcoholism and drug addiction.   [place] [phone] - affordable and regimented recovery services including Recovery Coaching, AA/NA meetings, job readiness training, and financial stability classes.  [place] [phone] [place] [phone]/ [phone] - multiple all-male drug free home locations providing group support in self-sufficiency.    Librarian, academic is a Database administrator who can connect you to community resources to assist in your care and future care planning, including obtaining caregivers or alternate living arrangements from temporary to long-term, or connection to [employer]. Local Advisor is Nanda Quinton [phone]. Please contact him for further guidance to meet your needs.          [facility] pays for nursing home care for state residents who are medically and financially eligible for such care. [place] Medicaid also offers Medicaid programs for seniors who require nursing home level care or have slightly lesser care requirements and do not wish to reside in a nursing home. These programs provide care at home or "in the community". Seniors can apply for [facility] Medicaid online at [employer] [email or web address] Alternatively, they can complete and submit the required forms to their local [place] office [facility], [place], [place],  [place] [address]). Since the application  process varies based on the program for which a senior is applying, it can be confusing. For Medicaid related questions or for assistance, persons can contact [employer] at [phone].

## 2022-03-07 NOTE — ED Notes (Signed)
Pt was able to walk with walker and without walker.      Ned Card [place], RN  03/07/22 1721

## 2022-05-31 ENCOUNTER — Emergency Department: Admit: 2022-05-31 | Payer: MEDICARE

## 2022-05-31 ENCOUNTER — Inpatient Hospital Stay: Admit: 2022-05-31 | Discharge: 2022-06-01 | Disposition: A | Discharge status: Home / Self Care | Payer: MEDICARE

## 2022-05-31 DIAGNOSIS — M25512 Pain in left shoulder: Secondary | ICD-10-CM

## 2022-05-31 LAB — CBC WITH AUTO DIFFERENTIAL
Absolute Baso #: 0 10*3/uL (ref 0.0–0.2)
Absolute Eos #: 0.1 10*3/uL (ref 0.0–0.5)
Absolute Lymph #: 1.9 10*3/uL (ref 1.0–3.2)
Absolute Mono #: 0.6 10*3/uL (ref 0.3–1.0)
Basophils %: 0.2 % (ref 0.0–2.0)
Eosinophils %: 1.7 % (ref 0.0–7.0)
Hematocrit: 40.1 % (ref 38.0–52.0)
Hemoglobin: 13.6 g/dL (ref 13.0–17.3)
Immature Grans (Abs): 0.02 10*3/uL (ref 0.00–0.06)
Immature Granulocytes: 0.4 % (ref 0.0–0.6)
Lymphocytes: 40.4 % (ref 15.0–45.0)
MCH: 27.3 pg (ref 27.0–34.5)
MCHC: 33.9 g/dL (ref 32.0–36.0)
MCV: 80.5 fL — ABNORMAL LOW (ref 84.0–100.0)
MPV: 9.7 fL (ref 7.2–13.2)
Monocytes: 12.6 % — ABNORMAL HIGH (ref 4.0–12.0)
NRBC Absolute: 0 10*3/uL (ref 0.000–0.012)
NRBC Automated: 0 % (ref 0.0–0.2)
Neutrophils %: 44.7 % (ref 42.0–74.0)
Neutrophils Absolute: 2.1 10*3/uL (ref 1.6–7.3)
Platelets: 211 10*3/uL (ref 140–440)
RBC: 4.98 x10e6/mcL (ref 4.00–5.60)
RDW: 14.1 % (ref 11.0–16.0)
WBC: 4.7 10*3/uL (ref 3.8–10.6)

## 2022-05-31 LAB — BASIC METABOLIC PANEL
Anion Gap: 10 mmol/L (ref 2–17)
BUN: 19 mg/dL (ref 8–23)
CO2: 24 mmol/L (ref 22–29)
Calcium: 8.8 mg/dL (ref 8.8–10.2)
Chloride: 105 mmol/L (ref 98–107)
Creatinine: 0.9 mg/dL (ref 0.7–1.3)
Est, Glom Filt Rate: 96 mL/min/1.73m (ref >=60)
Glucose: 105 mg/dL — ABNORMAL HIGH (ref 70–99)
OSMOLALITY CALCULATED: 280 mOsm/kg (ref 270–287)
Potassium: 3.8 mmol/L (ref 3.5–5.3)
Sodium: 139 mmol/L (ref 135–145)

## 2022-05-31 LAB — TROPONIN: Troponin, High Sensitivity: 12 ng/L (ref 0–22)

## 2022-05-31 LAB — EKG 12-LEAD

## 2022-05-31 MED ORDER — FENTANYL CITRATE (PF) 100 MCG/2ML IJ SOLN
100 MCG/2ML | Freq: Once | INTRAMUSCULAR | Status: AC
Start: 2022-05-31 — End: 2022-05-31
  Administered 2022-05-31: 23:00:00 25 ug via INTRAVENOUS

## 2022-05-31 MED ORDER — ACETAMINOPHEN 500 MG PO TABS
500 MG | Freq: Once | ORAL | Status: AC
Start: 2022-05-31 — End: 2022-05-31
  Administered 2022-05-31: 23:00:00 1000 mg via ORAL

## 2022-05-31 MED FILL — ACETAMINOPHEN EXTRA STRENGTH 500 MG PO TABS: 500 MG | ORAL | Qty: 2

## 2022-05-31 MED FILL — FENTANYL CITRATE (PF) 100 MCG/2ML IJ SOLN: 100 MCG/2ML | INTRAMUSCULAR | Qty: 2

## 2022-05-31 NOTE — ED Provider Notes (Signed)
[facility] EMERGENCY DEPT  EMERGENCY DEPARTMENT ENCOUNTER      Pt Name: Jermaine Perez.  MRN: [id number]  Birthdate 1960-05-09  Date of evaluation: 05/31/2022  Provider: Elias Else, MD  Provider evaluation time: 05/31/22 1722    CHIEF COMPLAINT       Chief Complaint   Patient presents with    Shoulder Pain     Left shoulder pain without injury       HISTORY OF PRESENT ILLNESS    Patient is a 62 year old male who denies any significant past medical history who presents for left scapular pain.  Patient states he woke up with it this morning.  Patient states it is sharp.  Patient states it occasionally radiates down his left arm.  Patient without any anterior chest pain patient states when he breathes in deeply it hurts worse in his scapula patient denies any shortness of breath.  Patient has never had this previous.  Patient is not on any anticoagulation.  Patient denies any neurological deficits, abdominal pain.  Patient ambulatory without difficulty          Nursing Notes were reviewed.    REVIEW OF SYSTEMS     Review of Systems   Constitutional:  Negative for chills and fever.   Respiratory:  Negative for shortness of breath.    Cardiovascular:  Negative for chest pain and palpitations.   Gastrointestinal:  Negative for abdominal pain, nausea and vomiting.   Genitourinary:  Negative for dysuria.   Musculoskeletal:  Positive for back pain. Negative for neck pain and neck stiffness.        Left shoulder pain   Skin:  Negative for rash.   Neurological:  Negative for dizziness, weakness and headaches.   All other systems reviewed and are negative.      PAST MEDICAL & SURGICAL HISTORY   No past medical history on file.    No past surgical history on file.    CURRENT MEDICATIONS       Discharge Medication List as of 05/31/2022  8:36 PM          ALLERGIES     Celecoxib and Ibuprofen    FAMILY & SOCIAL HISTORY     No family history on file.     Social History     Socioeconomic History    Marital status: Single       PHYSICAL  EXAM       ED Triage Vitals [05/31/22 1719]   BP Temp Temp Source Pulse Respirations SpO2 Height Weight - Scale   118/73 98.2 F (36.8 C) Oral 86 14 98 % 2.007 m (6\' 7" ) 104.3 kg (230 lb)       Physical Exam  Vitals and nursing note reviewed.   Constitutional:       General: He is not in acute distress.     Appearance: He is not ill-appearing or diaphoretic.   HENT:      Head: Normocephalic and atraumatic.      Mouth/Throat:      Mouth: Mucous membranes are moist.      Comments: Airway patent  Eyes:      Pupils: Pupils are equal, round, and reactive to light.   Cardiovascular:      Rate and Rhythm: Normal rate and regular rhythm.      Pulses: Normal pulses.   Pulmonary:      Effort: No respiratory distress.      Breath sounds: No wheezing or rhonchi.   Abdominal:  Palpations: Abdomen is soft.      Tenderness: There is no abdominal tenderness.   Musculoskeletal:         General: No swelling or tenderness. Normal range of motion.      Cervical back: Neck supple.      Comments: Neurovascular intact distally.  Mild tenderness with elevation of left upper extremity, no weakness, median ulnar and radial nerve intact.  Radial pulses 2+ in upper extremities bilaterally.  Mild tenderness over the left scapula   Skin:     General: Skin is warm and dry.   Neurological:      General: No focal deficit present.      Mental Status: He is alert and oriented to person, place, and time. Mental status is at baseline.   Psychiatric:         Mood and Affect: Mood normal.         Behavior: Behavior normal.         Procedures    DIAGNOSTIC RESULTS     CTA CHEST AORTA   Final Result      Ascending aortic aneurysm measuring 4.1 cm. No evidence of dissection or other    acute aortic pathology.      Mild bronchial wall thickening with some scattered areas of mucus plugging most    consistent with bronchitis. No acute consolidations.      XR SHOULDER LEFT (MIN 2 VIEWS)   Final Result      Mild degenerative changes with no acute osseous  abnormality.      XR CHEST PORTABLE   Final Result      No evidence of acute cardiopulmonary disease.            ED COURSE, REASSESSMENT and MDM:     ED Course as of 05/31/22 2123   Fri May 31, 2022   1804 EKG is sinus rhythm at a rate of 68 withtrace  ST elevation or depression or flipped T waves. [JK]   1324 I personally reviewed chest x-ray.  There is no signs of widened mediastinum, pleural effusion, pneumothorax or infiltrate.   [MW]   1027 Patient's troponin not elevated.  Patient has had this pain all day and does not need a delta troponin. [JK]   2007 Discussed with on-call vascular surgeon who recommends patient following up in outpatient clinic by calling [phone].  No acute intervention is necessary [JK]      ED Course User Index  [JK] Elias Else, MD       Medical Decision Making  CTA shows aneurysm of ascending aorta 4.1, normal is 3 to 4 cm.  Patient is 6 foot 7 and this could be likely due to patient's size and this is normal for him.  Patient without any chest pain.  Heart rate of evaluation pain seems more reproducible in the shoulder joint.  Current pain intrinsic disorder of shoulder.  Discussed with patient proper outpatient follow-up.  Gave patient follow-up with vascular surgery after discussing with vascular surgery.  Possible bronchitis.  And rediscussion patient has had some chronic coughing.  Will prescribe azithromycin.  Additionally prescribed pain medication, Percocet for patient's shoulder pain.  Discussed positives and negatives and side effects of medication.  Patient believes benefits outweigh risks.  I personally discussed results of labs and imaging with patient.  Patient understand strict return to ED precautions as well as proper outpatient follow-up with primary care physician and/or specialist.      Amount and/or Complexity of Data  Reviewed  Labs: ordered.  Radiology: ordered.  ECG/medicine tests: ordered.  Discussion of management or test interpretation with external  provider(s): Vascular surgery    Risk  OTC drugs.  Prescription drug management.            FINAL IMPRESSION      1. Acute pain of left shoulder    2. Aneurysm of ascending aorta without rupture ([place])    3. Bronchitis          DISPOSITION/PLAN   DISPOSITION Decision To Discharge 05/31/2022 08:36:07 PM      PATIENT REFERRED TO:  vascular surgery  [phone]        Your PMD or  [place] (free clinic)  [place], [place] [address] ph-[phone] [place] employed or residents only  Mon-Thurs, 8:30am-4:00pm call for appointment  [email or web address]          DISCHARGE MEDICATIONS:  Discharge Medication List as of 05/31/2022  8:36 PM        START taking these medications    Details   azithromycin (ZITHROMAX) 250 MG tablet Take 1 tablet by mouth See Admin Instructions for 5 days 500mg  on day 1 followed by 250mg  on days 2 - 5, Disp-6 tablet, R-0Print      oxyCODONE-acetaminophen (PERCOCET) 5-325 MG per tablet Take 1 tablet by mouth every 6 hours as needed for Pain for up to 3 days. Intended supply: 3 days. Take lowest dose possible to manage pain Max Daily Amount: 4 tablets, Disp-12 tablet, R-0Print             (Please note that portions of this note were completed with a voice recognition transcription program.  Efforts were made to ensure the accuracy of the dictations, but occasionally words are mis-transcribed.)    Elias Else, MD (electronically signed)  Emergency Medicine     Elias Else, MD  05/31/22 2123

## 2022-06-01 LAB — EKG 12-LEAD
P Axis: 81 degrees
P-R Interval: 136 ms
Q-T Interval: 438 ms
QRS Duration: 90 ms
QTc Calculation (Bazett): 454 ms
R Axis: -24 degrees
T Axis: 30 degrees
Ventricular Rate: 68 {beats}/min

## 2022-06-01 MED ORDER — AZITHROMYCIN 250 MG PO TABS
250 MG | ORAL_TABLET | ORAL | 0 refills | Status: AC
Start: 2022-06-01 — End: 2022-06-05

## 2022-06-01 MED ORDER — OXYCODONE-ACETAMINOPHEN 5-325 MG PO TABS
5-325 MG | ORAL_TABLET | Freq: Four times a day (QID) | ORAL | 0 refills | Status: AC | PRN
Start: 2022-06-01 — End: 2022-06-03

## 2022-06-01 MED ORDER — LIDOCAINE 4 % EX PTCH
4 % | Freq: Once | CUTANEOUS | Status: DC
Start: 2022-06-01 — End: 2022-05-31
  Administered 2022-06-01: 02:00:00 1 via TRANSDERMAL

## 2022-06-01 MED ORDER — IOPAMIDOL 76 % IV SOLN
76 % | Freq: Once | INTRAVENOUS | Status: AC | PRN
Start: 2022-06-01 — End: 2022-05-31
  Administered 2022-06-01: 100 mL via INTRAVENOUS

## 2022-06-01 MED FILL — LIDOCAINE PAIN RELIEF 4 % EX PTCH: 4 % | CUTANEOUS | Qty: 1

## 2022-10-26 ENCOUNTER — Inpatient Hospital Stay: Admit: 2022-10-26 | Discharge: 2022-10-27 | Disposition: A | Discharge status: Home / Self Care | Payer: MEDICARE

## 2022-10-26 DIAGNOSIS — R319 Hematuria, unspecified: Secondary | ICD-10-CM

## 2022-10-26 LAB — CBC WITH AUTO DIFFERENTIAL
Basophils %: 0.4 % (ref 0.0–2.0)
Basophils Absolute: 0 10*3/uL (ref 0.0–0.2)
Eosinophils %: 5.7 % (ref 0.0–7.0)
Eosinophils Absolute: 0.3 10*3/uL (ref 0.0–0.5)
Hematocrit: 35.4 % — ABNORMAL LOW (ref 38.0–52.0)
Hemoglobin: 12.4 g/dL — ABNORMAL LOW (ref 13.0–17.3)
Immature Grans (Abs): 0.02 10*3/uL (ref 0.00–0.06)
Immature Granulocytes %: 0.4 % (ref 0.0–0.6)
Lymphocytes Absolute: 1.2 10*3/uL (ref 1.0–3.2)
Lymphocytes: 26.9 % (ref 15.0–45.0)
MCH: 28.4 pg (ref 27.0–34.5)
MCHC: 35 g/dL (ref 32.0–36.0)
MCV: 81 fL — ABNORMAL LOW (ref 84.0–100.0)
MPV: 10.5 fL (ref 7.2–13.2)
Monocytes %: 9.8 % (ref 4.0–12.0)
Monocytes Absolute: 0.5 10*3/uL (ref 0.3–1.0)
NRBC Absolute: 0 10*3/uL (ref 0.000–0.012)
NRBC Automated: 0 % (ref 0.0–0.2)
Neutrophils %: 56.8 % (ref 42.0–74.0)
Neutrophils Absolute: 2.6 10*3/uL (ref 1.6–7.3)
Platelets: 128 10*3/uL — ABNORMAL LOW (ref 140–440)
RBC: 4.37 x10e6/mcL (ref 4.00–5.60)
RDW: 13.4 % (ref 11.0–16.0)
WBC: 4.6 10*3/uL (ref 3.8–10.6)

## 2022-10-26 LAB — URINALYSIS W/ RFLX MICROSCOPIC
Bilirubin, Urine: NEGATIVE
Glucose, Ur: NEGATIVE mg/dL
Ketones, Urine: NEGATIVE mg/dL
Nitrite, Urine: NEGATIVE
Protein, UA: 300 — AB
Specific Gravity, UA: >=1.03 — AB (ref 1.003–1.035)
Urobilinogen, Urine: 1 EU/dL (ref >1.0)
pH, Urine: 5.5 (ref 4.5–8.0)

## 2022-10-26 LAB — BASIC METABOLIC PANEL
Anion Gap: 7 mmol/L (ref 2–17)
BUN: 24 mg/dL — ABNORMAL HIGH (ref 8–23)
CO2: 24 mmol/L (ref 22–29)
Calcium: 8.9 mg/dL (ref 8.5–10.7)
Chloride: 104 mmol/L (ref 98–107)
Creatinine: 1.2 mg/dL (ref 0.7–1.3)
Est, Glom Filt Rate: 68 mL/min/1.73m (ref >=60)
Glucose: 84 mg/dL (ref 70–99)
Osmolaliy Calculated: 273 mOsm/kg (ref 270–287)
Sodium: 135 mmol/L (ref 135–145)

## 2022-10-26 LAB — IMMATURE PLATELET FRACTION
Immature Platelet, Absolute: 4 10*3/uL
Immature Platelet, Percent: 3.1 % (ref 1.2–8.6)

## 2022-10-26 LAB — MICROSCOPIC URINALYSIS

## 2022-10-26 LAB — MORPHOLOGY CHECK
Platelet Estimate: DECREASED — AB
RBC Morphology: NORMAL

## 2022-10-26 MED ORDER — CIPROFLOXACIN HCL 500 MG PO TABS
500 | ORAL_TABLET | Freq: Two times a day (BID) | ORAL | 0 refills | Status: AC
Start: 2022-10-26 — End: 2022-11-02

## 2022-10-26 MED ORDER — CEFTRIAXONE SODIUM 1 G IJ SOLR
1 | Freq: Once | INTRAMUSCULAR | Status: AC
Start: 2022-10-26 — End: 2022-10-26
  Administered 2022-10-26: 23:00:00 1000 mg via INTRAVENOUS

## 2022-10-26 MED FILL — CEFTRIAXONE SODIUM 1 G IJ SOLR: 1 g | INTRAMUSCULAR | Qty: 1000

## 2022-10-26 NOTE — ED Notes (Incomplete)
Pt states he cannot

## 2022-10-26 NOTE — ED Notes (Signed)
Blanket given to pt

## 2022-10-26 NOTE — ED Notes (Signed)
Pt unable to provide urine sample at this time, states he went when he was in the lobby waiting for a room.

## 2022-10-26 NOTE — ED Provider Notes (Signed)
[facility] EMERGENCY DEPT  EMERGENCY DEPARTMENT ENCOUNTER      Pt Name: Jermaine Perez.  MRN: [id number]  Birthdate 31-Jul-1960  Date/Time of evaluation: 10/26/2022  Provider evaluation time: 10/26/22 1553  Provider: Anderson Malta, MD    CHIEF COMPLAINT       Chief Complaint   Patient presents with    Hematuria     Pt states started having hematuria this morning. Denies pain. States had issues with prostate and had hematuria 4 yrs ago. No problems since.          HISTORY OF PRESENT ILLNESS      Jermaine Javens. is a 62 y.o. male who presents to the emergency department with hematuria.     HPI  62 year old male with reported past medical history of an enlarged prostate requiring surgery several years ago in [place] presents to the emergency department after noticing bright red blood from his urethral meatus today.  He denies any dysuria, frequency urgency or additional systemic symptoms.  No fevers or chills.  No nausea or vomiting.  He denies any abdominal pain or back pain.  Patient states that he is able to urinate and does not feel like he is having any signs of obstruction.  No clots reported.  No night sweats or weight loss.  No bowel or bladder dysfunction.  No saddle paresthesias.  No additional complaints.      Nursing Notes were reviewed.    REVIEW OF SYSTEMS         Review of Systems   Constitutional:  Negative for fever and unexpected weight change.   HENT:  Negative for congestion.    Respiratory:  Negative for chest tightness and shortness of breath.    Cardiovascular:  Negative for chest pain, palpitations and leg swelling.   Gastrointestinal:  Negative for abdominal pain, diarrhea, nausea and vomiting.   Genitourinary:  Positive for hematuria. Negative for decreased urine volume, difficulty urinating, dysuria, flank pain, frequency, genital sores, penile pain, penile swelling, scrotal swelling, testicular pain and urgency.   Musculoskeletal:  Negative for back pain.   Skin:  Negative for rash.    Neurological:  Negative for seizures, weakness and headaches.   Psychiatric/Behavioral:  Negative for confusion.        Except as noted above the remainder of the review of systems was reviewed and negative.       PAST MEDICAL HISTORY   No past medical history on file.      SURGICAL HISTORY     No past surgical history on file.      CURRENT MEDICATIONS       Discharge Medication List as of 10/26/2022  6:52 PM          ALLERGIES     Celecoxib and Ibuprofen    FAMILY HISTORY     No family history on file.       SOCIAL HISTORY       Social History     Socioeconomic History    Marital status: Single       SCREENINGS         Glasgow Coma Scale  Eye Opening: Spontaneous  Best Verbal Response: Oriented  Best Motor Response: Obeys commands  Glasgow Coma Scale Score: 15                     CIWA Assessment  BP: (!) 129/102  Pulse: 68  PHYSICAL EXAM         ED Triage Vitals   BP Temp Temp Source Pulse Respirations SpO2 Height Weight - Scale   10/26/22 1408 10/26/22 1409 10/26/22 1409 10/26/22 1408 10/26/22 1408 10/26/22 1408 10/26/22 1408 10/26/22 1408   (!) 116/90 98 F (36.7 C) Oral 68 20 99 % 2.007 m (6\' 7" ) 111.1 kg (245 lb)       Physical Exam  Vitals and nursing note reviewed.   Constitutional:       General: He is not in acute distress.     Appearance: He is not toxic-appearing.   HENT:      Head: Normocephalic and atraumatic.   Pulmonary:      Effort: Pulmonary effort is normal.   Abdominal:      General: There is no distension.      Palpations: There is no mass.      Tenderness: There is no abdominal tenderness. There is no guarding or rebound.      Hernia: No hernia is present.   Genitourinary:     Penis: Normal.       Testes: Normal.   Musculoskeletal:         General: No swelling.   Skin:     General: Skin is warm and dry.      Findings: No rash.   Neurological:      General: No focal deficit present.      Mental Status: He is alert and oriented to person, place, and time.   Psychiatric:          Mood and Affect: Mood normal.         DIAGNOSTIC RESULTS       No orders to display           LABS:  Labs Reviewed   URINALYSIS W/ RFLX MICROSCOPIC - Abnormal; Notable for the following components:       Result Value    Color, UA Orange (*)     Appearance Turbid (*)     Specific Gravity, UA >=1.030 (*)     Blood, Urine Large (*)     Protein, UA 300 (*)     Leukocyte Esterase, Urine Trace (*)     All other components within normal limits   CBC WITH AUTO DIFFERENTIAL - Abnormal; Notable for the following components:    Hemoglobin 12.4 (*)     Hematocrit 35.4 (*)     MCV 81.0 (*)     Platelets 128 (*)     All other components within normal limits   BASIC METABOLIC PANEL - Abnormal; Notable for the following components:    Potassium Hemolyzed (*)     BUN 24 (*)     All other components within normal limits   MORPHOLOGY CHECK - Abnormal; Notable for the following components:    Platelet Estimate Decreased (*)     All other components within normal limits   MICROSCOPIC URINALYSIS - Abnormal; Notable for the following components:    RBC, UA 11-20 (*)     BACTERIA, URINE Moderate (*)     All other components within normal limits    Narrative:     Added on by Discern Rule   CULTURE, URINE   IMMATURE PLATELET FRACTION       All other labs were within normal range or not returned as of this dictation.    EMERGENCY DEPARTMENT COURSE and DIFFERENTIAL DIAGNOSIS/MDM:   Vitals:    Vitals:  10/26/22 1629 10/26/22 1659 10/26/22 1729 10/26/22 1930   BP: 102/75 (!) 145/104 (!) 135/91 (!) 129/102   Pulse:       Resp:       Temp:       TempSrc:       SpO2: 100% 99%     Weight:       Height:             Medical Decision Making  62 year old male with history and physical described above presents to the emergency department with hematuria.  No evidence of obstruction.  He is urinating without difficulty.  I have considered infection, cancer, kidney stone, vascular pathology among others.  He has no abdominal pain.  No back pain.  No  fevers or chills.  No dysuria.  His GU exam is unremarkable.  Labs including CBC, BMP and UA ordered and pending for further evaluation.    Amount and/or Complexity of Data Reviewed  Labs: ordered.    Risk  Prescription drug management.           REASSESSMENT     ED Course as of 10/27/22 2217   Sat Oct 26, 2022   1824 Patient's workup has been reviewed.  Will treat for possible prostatitis.  Patient is urinating without difficulty.  He remains hemodynamically stable.  A dose of Rocephin has been given here in the department.  I will give patient follow-up information for urology with return precautions.  He is very comfortable with this treatment plan and otherwise stable for discharge. [DG]      ED Course User Index  [DG] Jetty Duhamel Andris Baumann, MD             CONSULTS:  None    PROCEDURES:  Unless otherwise noted below, none     Procedures        FINAL IMPRESSION      1. Hematuria, unspecified type          DISPOSITION/PLAN   DISPOSITION Decision To Discharge 10/26/2022 06:49:57 PM      PATIENT REFERRED TO:  [facility] EMERGENCY DEPT  [address]  [place] [address]  [phone]    As needed    Luanne Bras, MD  [address]  [place] [address]  [phone]    Schedule an appointment as soon as possible for a visit         DISCHARGE MEDICATIONS:  Discharge Medication List as of 10/26/2022  6:52 PM        START taking these medications    Details   ciprofloxacin (CIPRO) 500 MG tablet Take 1 tablet by mouth 2 times daily for 7 days, Disp-14 tablet, R-0Print             (Please note that portions of this note were completed with a voice recognition program.  Efforts were made to edit the dictations but occasionally words are mis-transcribed.)    Anderson Malta, MD (electronically signed)  Attending Emergency Physician            Anderson Malta, MD  10/27/22 2217

## 2022-10-27 LAB — CULTURE, URINE: FINAL REPORT: 25000

## 2023-12-11 LAB — URINALYSIS WITH REFLEX TO CULTURE
Glucose, Ur: NEGATIVE
Hyaline Casts, UA: NONE SEEN /LPF
Ketones, Urine: NEGATIVE
Protein, UA: 100 — AB
Specific Gravity, UA: >=1.03 — AB (ref 1.003–1.035)
pH, Urine: 5 (ref 4.5–8.0)

## 2023-12-11 LAB — CBC WITH AUTO DIFFERENTIAL
Basophils %: 0.6 % (ref 0.0–2.0)
Basophils Absolute: 0 x10e3/mcL (ref 0.0–0.2)
Eosinophils %: 7.6 % — ABNORMAL HIGH (ref 0.0–7.0)
Eosinophils Absolute: 0.2 x10e3/mcL (ref 0.0–0.5)
Hematocrit: 39.6 % (ref 38.0–52.0)
Hemoglobin: 13.6 g/dL (ref 13.0–17.3)
Immature Grans (Abs): 0 x10e3/mcL (ref 0.00–0.06)
Immature Granulocytes %: 0 % (ref 0.0–0.6)
Lymphocytes Absolute: 1.1 x10e3/mcL (ref 1.0–3.2)
Lymphocytes: 35.6 % (ref 15.0–45.0)
MCH: 27.5 pg (ref 27.0–34.5)
MCHC: 34.3 g/dL (ref 30.0–36.0)
MCV: 80 fL — ABNORMAL LOW (ref 84.0–100.0)
MPV: 10 fL (ref 7.0–12.2)
Monocytes %: 9.5 % (ref 4.0–12.0)
Monocytes Absolute: 0.3 x10e3/mcL (ref 0.3–1.0)
NRBC Absolute: 0 x10e3/mcL (ref 0.000–0.012)
NRBC Automated: 0 % (ref 0.0–0.2)
Neutrophils %: 46.7 % (ref 42.0–74.0)
Neutrophils Absolute: 1.5 x10e3/mcL — ABNORMAL LOW (ref 1.6–7.3)
Platelets: 198 x10e3/mcL (ref 140–440)
RBC: 4.95 x10e6/mcL (ref 4.00–5.60)
RDW: 13.7 % (ref 10.0–17.0)
WBC: 3.2 x10e3/mcL — ABNORMAL LOW (ref 3.8–10.6)

## 2023-12-11 LAB — BASIC METABOLIC PANEL
Anion Gap: 11 mmol/L (ref 2–17)
BUN: 22 mg/dL (ref 8–23)
CO2: 20 mmol/L — ABNORMAL LOW (ref 22–29)
Calcium: 8.6 mg/dL (ref 8.5–10.7)
Chloride: 108 mmol/L — ABNORMAL HIGH (ref 98–107)
Creatinine: 1 mg/dL (ref 0.7–1.3)
Est, Glom Filt Rate: 84 mL/min/1.73m (ref >=60)
Glucose: 94 mg/dL (ref 70–99)
Osmolaliy Calculated: 281 mosm/kg (ref 270–287)
Potassium: 4 mmol/L (ref 3.5–5.3)
Sodium: 139 mmol/L (ref 135–145)

## 2023-12-11 NOTE — Discharge Instructions (Signed)
 Please make an appointment with urology, either Dr. Midge or Dr. Debarah, as soon as possible.     Return to the emergency room with any worsening or concerning symptoms

## 2023-12-11 NOTE — ED Provider Notes (Signed)
 [facility] EMERGENCY DEPT  EMERGENCY DEPARTMENT ENCOUNTER      Pt Name: Jermaine Perez.  MRN: [id number]  Birthdate [birth year]  Date of evaluation: 12/11/2023  Provider: Lauraine JAYSON Garner, PA-C  Provider evaluation time: 12/11/23 [phone]    CHIEF COMPLAINT       Chief Complaint   Patient presents with    Hematuria     Pt states he started passing blood clots in urine x 3 days- hx bph- states clots worse today and started to have urinary retention         HISTORY OF PRESENT ILLNESS    63 year old pleasant male with past medical history of BPH presents with a chief complaint of hematuria.  Blood in his urine x 3 days. No dysuria or abdominal/pelvic pain.  Endorses hematuria intermittently since his BPH surgery 4 years ago.  Says it usually lasts half a day and resolves on its own.  [facility] [place] .  His last appointment was in February 2025.  He says they did a scope and biopsy which he reports was normal.  I do not have the results in his chart.  He has not obtained a urologist in [place] at this point.  Denies difficulty urinating.  Denies pain with urination.  Says he takes Flomax 2 times daily. No pain associated.  Says he will obtain a urologist soon as possible.  He has a phone number for Dr. Midge          Nursing Notes were reviewed.    REVIEW OF SYSTEMS     Review of Systems   Genitourinary:  Positive for hematuria. Negative for dysuria.       Except as noted above the remainder of the review of systems was reviewed and negative.     PAST MEDICAL HISTORY   No past medical history on file.    SURGICAL HISTORY     No past surgical history on file.    CURRENT MEDICATIONS       There are no discharge medications for this patient.      ALLERGIES     Celecoxib and Ibuprofen    FAMILY HISTORY     No family history on file.     SOCIAL HISTORY       Social History     Socioeconomic History    Marital status: Single     Social Drivers of Health     Social Connections: Unknown  (02/20/2020)    Received from [facility]    Social Connections     Frequency of Communication with Friends and Family: Not asked     Frequency of Social Gatherings with Friends and Family: Not asked   Intimate Partner Violence: Not At Risk (11/05/2022)    Received from [facility]     Abuse Screen     Feels Unsafe at Home or Work/School: no     Feels Threatened by Someone: no     Does Anyone Try to Keep You From Having Contact with Others or Doing Things Outside Your Home?: no     Physical Signs of Abuse Present: no   Housing Stability: Not At Risk (07/06/2020)    Received from [facility] Stability     Was there a time when you did not have a steady place to sleep: Unrecognized value     Worried that the place you are staying is making you sick: Unrecognized  value       SCREENINGS         Glasgow Coma Scale  Eye Opening: Spontaneous  Best Verbal Response: Oriented  Best Motor Response: Obeys commands  Glasgow Coma Scale Score: 15                     CIWA Assessment  BP: 129/65  Pulse: 65                 PHYSICAL EXAM       ED Triage Vitals [12/11/23 0937]   BP Girls Systolic BP Percentile Girls Diastolic BP Percentile Boys Systolic BP Percentile Boys Diastolic BP Percentile Temp Temp Source Pulse   125/77 -- -- -- -- 98.7 F (37.1 C) Oral 68      Respirations SpO2 Height Weight - Scale       15 99 % 2.007 m (6' 7) 125.6 kg (277 lb)           Physical Exam  Vitals and nursing note reviewed.   Constitutional:       General: He is not in acute distress.  HENT:      Head: Normocephalic and atraumatic.      Mouth/Throat:      Comments: Airway patent  Cardiovascular:      Rate and Rhythm: Normal rate.      Comments: Normal peripheral perfusion  Pulmonary:      Effort: Pulmonary effort is normal. No respiratory distress.   Musculoskeletal:         General: No deformity.   Skin:     General: Skin is warm and dry.   Neurological:      Mental Status: He is alert and oriented to person,  place, and time. Mental status is at baseline.      Comments: Alert; Moving all extremities   Psychiatric:         Mood and Affect: Mood normal.         Behavior: Behavior normal.         Procedures    DIAGNOSTIC RESULTS     RADIOLOGY:   No orders to display       LABS:  Labs Reviewed   BASIC METABOLIC PANEL - Abnormal; Notable for the following components:       Result Value    Chloride 108 (*)     CO2 20 (*)     All other components within normal limits   CBC WITH AUTO DIFFERENTIAL - Abnormal; Notable for the following components:    WBC 3.2 (*)     MCV 80.0 (*)     Eosinophils % 7.6 (*)     Neutrophils Absolute 1.5 (*)     All other components within normal limits   URINALYSIS WITH REFLEX TO CULTURE - Abnormal; Notable for the following components:    Color, UA Red (*)     Appearance Turbid (*)     Specific Gravity, UA >=1.030 (*)     Blood, Urine Moderate (*)     Protein, UA 100 (*)     WBC, UA 3-5 (*)     RBC, UA TNTC (*)     BACTERIA, URINE Few (*)     All other components within normal limits       All other labs were within normal range or not returned as of this dictation.    EMERGENCY DEPARTMENT COURSE/REASSESSMENT and MDM:     ED Course as of 12/11/23  8296   Thu Dec 11, 2023   1033 Stable hemoglobin  [SD]   1204 Lab currently working on UA [SD]   1224 Lab needed repeat urine [SD]   1252 Dispo pending UA [SD]      ED Course User Index  [SD] Shreshta Medley, Lauraine BROCKS, PA-C       MDM  Number of Diagnoses or Management Options  Gross hematuria  Diagnosis management comments: This is a 63 year old male with BPH and recurrent hematuria presenting with 3 days of frank hematuria.  Patient is able to void in the emergency room, postvoid residual volume minimal.  Without pain or dysuria.  Stable hemoglobin.  Urinalysis without evidence of significant urinary tract infection.  Will have him follow-up with urology soon as possible.  Says he plans to walk up to the urology office after being discharged from the emergency room.   He is agreeable with this plan.  Patient discussed with Dr. Maryann, who agrees with the work and the plan at this patient.         FINAL IMPRESSION      1. Gross hematuria          DISPOSITION/PLAN   DISPOSITION Decision To Discharge 12/11/2023 12:01:42 PM   DISPOSITION CONDITION Stable           PATIENT REFERRED TO:  Debarah Case, MD  [address] [place]  [address] [place] [address]  [phone]    Schedule an appointment as soon as possible for a visit         DISCHARGE MEDICATIONS:  There are no discharge medications for this patient.        (Please note that portions of this note were completed with a voice recognition program.  Efforts were made to edit the dictations but occasionally words are mis-transcribed.)    Lauraine BROCKS Garner, PA-C (electronically signed)  Emergency Medicine           Garner Lauraine BROCKS, PA-C  12/11/23 1704

## 2023-12-30 ENCOUNTER — Encounter

## 2024-01-05 ENCOUNTER — Inpatient Hospital Stay: Admit: 2024-01-05 | Payer: Medicare (Managed Care) | Attending: Urology

## 2024-01-05 DIAGNOSIS — R319 Hematuria, unspecified: Secondary | ICD-10-CM

## 2024-01-05 MED ORDER — IOPAMIDOL 61 % IV SOLN
61 | Freq: Once | INTRAVENOUS | Status: AC | PRN
Start: 2024-01-05 — End: 2024-01-05
  Administered 2024-01-05: 20:00:00 125 mL via INTRAVENOUS
